# Patient Record
Sex: Male | Born: 1980 | Race: Black or African American | Hispanic: No | Marital: Single | Smoking: Current every day smoker
Health system: Southern US, Community
[De-identification: ages and names within clinical notes are randomized; demographics above are authoritative.]

## PROBLEM LIST (undated history)

## (undated) DIAGNOSIS — I10 Essential (primary) hypertension: Secondary | ICD-10-CM

---

## 2005-10-20 ENCOUNTER — Emergency Department: Payer: Self-pay | Admitting: Emergency Medicine

## 2007-04-18 ENCOUNTER — Emergency Department: Payer: Self-pay | Admitting: Internal Medicine

## 2007-10-06 ENCOUNTER — Emergency Department: Payer: Self-pay | Admitting: Emergency Medicine

## 2009-06-12 ENCOUNTER — Emergency Department: Payer: Self-pay | Admitting: Emergency Medicine

## 2009-06-13 ENCOUNTER — Emergency Department: Payer: Self-pay | Admitting: Emergency Medicine

## 2012-12-29 ENCOUNTER — Emergency Department: Payer: Self-pay | Admitting: Emergency Medicine

## 2013-01-01 ENCOUNTER — Emergency Department: Payer: Self-pay | Admitting: Internal Medicine

## 2013-06-24 ENCOUNTER — Emergency Department: Payer: Self-pay | Admitting: Emergency Medicine

## 2013-08-10 ENCOUNTER — Emergency Department: Payer: Self-pay | Admitting: Emergency Medicine

## 2013-11-09 ENCOUNTER — Emergency Department: Payer: Self-pay | Admitting: Emergency Medicine

## 2013-12-16 ENCOUNTER — Emergency Department: Payer: Self-pay | Admitting: Emergency Medicine

## 2014-01-01 ENCOUNTER — Emergency Department: Payer: Self-pay | Admitting: Emergency Medicine

## 2014-07-19 ENCOUNTER — Emergency Department: Payer: Self-pay | Admitting: Student

## 2014-10-01 ENCOUNTER — Emergency Department: Payer: Self-pay | Admitting: Emergency Medicine

## 2015-04-08 ENCOUNTER — Encounter: Payer: Self-pay | Admitting: *Deleted

## 2015-04-08 DIAGNOSIS — Z720 Tobacco use: Secondary | ICD-10-CM | POA: Diagnosis not present

## 2015-04-08 DIAGNOSIS — L02416 Cutaneous abscess of left lower limb: Secondary | ICD-10-CM | POA: Insufficient documentation

## 2015-04-08 NOTE — ED Notes (Signed)
Pt c/o boil on L medial upper thigh starting on Tues. Pt states he has been manipulating it. Pt states pain w/ ambulation and wound has been draining a little.

## 2015-04-09 ENCOUNTER — Emergency Department
Admission: EM | Admit: 2015-04-09 | Discharge: 2015-04-09 | Disposition: A | Discharge status: Home / Self Care | Payer: 59 | Attending: Emergency Medicine | Admitting: Emergency Medicine

## 2015-04-09 ENCOUNTER — Encounter: Payer: Self-pay | Admitting: General Practice

## 2015-04-09 DIAGNOSIS — L02416 Cutaneous abscess of left lower limb: Secondary | ICD-10-CM

## 2015-04-09 MED ORDER — SULFAMETHOXAZOLE-TRIMETHOPRIM 800-160 MG PO TABS: 1.0000 | ORAL_TABLET | Freq: Two times a day (BID) | ORAL | Status: DC

## 2015-04-09 MED ORDER — LIDOCAINE-EPINEPHRINE (PF) 1 %-1:200000 IJ SOLN
30.0000 mL | Freq: Once | INTRAMUSCULAR | Status: AC
  Administered 2015-04-09: 30 mL via INTRADERMAL
  Filled 2015-04-09: qty 30

## 2015-04-09 NOTE — Discharge Instructions (Signed)
Abscess  An abscess is an infected area that contains a collection of pus and debris.It can occur in almost any part of the body. An abscess is also known as a furuncle or boil.  CAUSES   An abscess occurs when tissue gets infected. This can occur from blockage of oil or sweat glands, infection of hair follicles, or a minor injury to the skin. As the body tries to fight the infection, pus collects in the area and creates pressure under the skin. This pressure causes pain. People with weakened immune systems have difficulty fighting infections and get certain abscesses more often.   SYMPTOMS  Usually an abscess develops on the skin and becomes a painful mass that is red, warm, and tender. If the abscess forms under the skin, you may feel a moveable soft area under the skin. Some abscesses break open (rupture) on their own, but most will continue to get worse without care. The infection can spread deeper into the body and eventually into the bloodstream, causing you to feel ill.   DIAGNOSIS   Your caregiver will take your medical history and perform a physical exam. A sample of fluid may also be taken from the abscess to determine what is causing your infection.  TREATMENT   Your caregiver may prescribe antibiotic medicines to fight the infection. However, taking antibiotics alone usually does not cure an abscess. Your caregiver may need to make a small cut (incision) in the abscess to drain the pus. In some cases, gauze is packed into the abscess to reduce pain and to continue draining the area.  HOME CARE INSTRUCTIONS    Only take over-the-counter or prescription medicines for pain, discomfort, or fever as directed by your caregiver.   If you were prescribed antibiotics, take them as directed. Finish them even if you start to feel better.   If gauze is used, follow your caregiver's directions for changing the gauze.   To avoid spreading the infection:   Keep your draining abscess covered with a  bandage.   Wash your hands well.   Do not share personal care items, towels, or whirlpools with others.   Avoid skin contact with others.   Keep your skin and clothes clean around the abscess.   Keep all follow-up appointments as directed by your caregiver.  SEEK MEDICAL CARE IF:    You have increased pain, swelling, redness, fluid drainage, or bleeding.   You have muscle aches, chills, or a general ill feeling.   You have a fever.  MAKE SURE YOU:    Understand these instructions.   Will watch your condition.   Will get help right away if you are not doing well or get worse.  Document Released: 04/18/2005 Document Revised: 01/08/2012 Document Reviewed: 09/21/2011  [employer] Patient Information 2015 [employer]. This information is not intended to replace advice given to you by your health care provider. Make sure you discuss any questions you have with your health care provider.  Incision and Drainage  Incision and drainage is a procedure in which a sac-like structure (cystic structure) is opened and drained. The area to be drained usually contains material such as pus, fluid, or blood.   LET YOUR CAREGIVER KNOW ABOUT:    Allergies to medicine.   Medicines taken, including vitamins, herbs, eyedrops, over-the-counter medicines, and creams.   Use of steroids (by mouth or creams).   Previous problems with anesthetics or numbing medicines.   History of bleeding problems or blood clots.     Previous surgery.   Other health problems, including diabetes and kidney problems.   Possibility of pregnancy, if this applies.  RISKS AND COMPLICATIONS   Pain.   Bleeding.   Scarring.   Infection.  BEFORE THE PROCEDURE   You may need to have an ultrasound or other imaging tests to see how large or deep your cystic structure is. Blood tests may also be used to determine if you have an infection or how severe the infection is. You may need to have a tetanus shot.  PROCEDURE   The affected area is cleaned with a  cleaning fluid. The cyst area will then be numbed with a medicine (local anesthetic). A small incision will be made in the cystic structure. A syringe or catheter may be used to drain the contents of the cystic structure, or the contents may be squeezed out. The area will then be flushed with a cleansing solution. After cleansing the area, it is often gently packed with a gauze or another wound dressing. Once it is packed, it will be covered with gauze and tape or some other type of wound dressing.  AFTER THE PROCEDURE    Often, you will be allowed to go home right after the procedure.   You may be given antibiotic medicine to prevent or heal an infection.   If the area was packed with gauze or some other wound dressing, you will likely need to come back in 1 to 2 days to get it removed.   The area should heal in about 14 days.  Document Released: 01/02/2001 Document Revised: 01/08/2012 Document Reviewed: 09/03/2011  [employer] Patient Information 2015 [employer]. This information is not intended to replace advice given to you by your health care provider. Make sure you discuss any questions you have with your health care provider.

## 2015-04-09 NOTE — ED Provider Notes (Signed)
[facility] Emergency Department Provider Note  ____________________________________________  Time seen: 5:00 AM  I have reviewed the triage vital signs and the nursing notes.   HISTORY  Chief Complaint Abscess    HPI Alexander Hebert is a 34 y.o. male who complains of an abscess to the left thigh over the past 3 days. Gradually gotten worse where he is pain and swelling in that area. Yesterday it was draining some purulent fluid although it is stopped now. He reports he's had multiple abscesses in the past. Fever chills abdominal pain genital lesions or dysuria or hematuria.     History reviewed. No pertinent past medical history.   There are no active problems to display for this patient.    History reviewed. No pertinent past surgical history.   Current Outpatient Rx  Name  Route  Sig  Dispense  Refill  . sulfamethoxazole-trimethoprim (BACTRIM DS) 800-160 MG per tablet   Oral   Take 1 tablet by mouth 2 (two) times daily.   14 tablet   0      Allergies Review of patient's allergies indicates no known allergies.   History reviewed. No pertinent family history.  Social History Social History  Substance Use Topics  . Smoking status: Current Every Day Smoker -- 1.50 packs/day    Types: Cigarettes  . Smokeless tobacco: Never Used  . Alcohol Use: Yes     Comment: occasionally    Review of Systems  Constitutional:   No fever or chills. No weight changes Eyes:   No blurry vision or double vision.  ENT:   No sore throat. Cardiovascular:   No chest pain. Respiratory:   No dyspnea or cough. Gastrointestinal:   Negative for abdominal pain, vomiting and diarrhea.  No BRBPR or melena. Genitourinary:   Negative for dysuria, urinary retention, bloody urine, or difficulty urinating. Musculoskeletal:   Negative for back pain. No joint swelling or pain. Skin:   Negative for rash. Abscess as above Neurological:   Negative for headaches, focal  weakness or numbness. Psychiatric:  No anxiety or depression.   Endocrine:  No hot/cold intolerance, changes in energy, or sleep difficulty.  10-point ROS otherwise negative.  ____________________________________________   PHYSICAL EXAM:  VITAL SIGNS: ED Triage Vitals  Enc Vitals Group     BP 04/08/15 2341 155/91 mmHg     Pulse Rate 04/08/15 2341 70     Resp 04/08/15 2341 20     Temp 04/08/15 2341 98.2 F (36.8 C)     Temp Source 04/08/15 2341 Oral     SpO2 04/08/15 2341 97 %     Weight 04/08/15 2341 274 lb (124.286 kg)     Height 04/08/15 2341 6\' 1"  (1.854 m)     Head Cir --      Peak Flow --      Pain Score 04/08/15 2345 7     Pain Loc --      Pain Edu? --      Excl. in GC? --      Constitutional:   Alert and oriented. Well appearing and in no distress. Eyes:   No scleral icterus. No conjunctival pallor. PERRL. EOMI ENT   Head:   Normocephalic and atraumatic.   Nose:   No congestion/rhinnorhea. No septal hematoma   Mouth/Throat:   MMM, no pharyngeal erythema. No peritonsillar mass. No uvula shift.   Neck:   No stridor. No SubQ emphysema. No meningismus. Hematological/Lymphatic/Immunilogical:   No cervical lymphadenopathy. Gastrointestinal:  Soft and nontender. No distention. There is no CVA tenderness.  No rebound, rigidity, or guarding. Genitourinary:   Normal genitalia Musculoskeletal:   Nontender with normal range of motion in all extremities. No joint effusions.  No lower extremity tenderness.  No edema. Neurologic:   Normal speech and language.  CN 2-10 normal. Motor grossly intact. No pronator drift.  Normal gait. No gross focal neurologic deficits are appreciated.  Skin:    There is a 3 cm round fluctuant abscess on the left medial thigh. No significant surrounding cellulitis. The area is tender to touch..  No petechiae, purpura, or bullae. Psychiatric:   Mood and affect are normal. Speech and behavior are normal. Patient exhibits appropriate  insight and judgment.  ____________________________________________    LABS (pertinent positives/negatives) (all labs ordered are listed, but only abnormal results are displayed) Labs Reviewed - No data to display ____________________________________________   EKG    ____________________________________________    RADIOLOGY    ____________________________________________   PROCEDURES INCISION AND DRAINAGE Performed by: Sharman Cheek Consent: Verbal consent obtained. Risks and benefits: risks, benefits and alternatives were discussed Type: abscess  Body area: Left thigh  Anesthesia: local infiltration  Incision was made with a scalpel.  Local anesthetic: lidocaine 1% with epinephrine  Anesthetic total: 1 ml  Complexity: complex Blunt dissection to break up loculations  Drainage: purulent  Drainage amount: 1ml  Packing material: 1/4 in iodoform gauze  Patient tolerance: Patient tolerated the procedure well with no immediate complications.     ____________________________________________   INITIAL IMPRESSION / ASSESSMENT AND PLAN / ED COURSE  Pertinent labs & imaging results that were available during my care of the patient were reviewed by me and considered in my medical decision making (see chart for details).   Patient presents with superficial abscess of the left thigh. This was incised and drained in the emergency Department with packing placed. Due to the patient's recurrent abscesses in the past, we'll place him on Bactrim. He is overall well-appearing no evidence of sepsis or any other spreading infection.     ____________________________________________   FINAL CLINICAL IMPRESSION(S) / ED DIAGNOSES  Final diagnoses:  Abscess of left thigh      Sharman Cheek, MD 04/09/15 [phone]

## 2015-04-09 NOTE — ED Notes (Signed)
Provided EDP with lidocaine.

## 2015-04-19 ENCOUNTER — Emergency Department
Admission: EM | Admit: 2015-04-19 | Discharge: 2015-04-19 | Discharge status: Against Medical Advice | Payer: 59 | Attending: Emergency Medicine | Admitting: Emergency Medicine

## 2015-04-19 ENCOUNTER — Encounter: Payer: Self-pay | Admitting: Emergency Medicine

## 2015-04-19 ENCOUNTER — Emergency Department: Payer: 59

## 2015-04-19 DIAGNOSIS — R52 Pain, unspecified: Secondary | ICD-10-CM

## 2015-04-19 DIAGNOSIS — Y998 Other external cause status: Secondary | ICD-10-CM | POA: Insufficient documentation

## 2015-04-19 DIAGNOSIS — S0591XA Unspecified injury of right eye and orbit, initial encounter: Secondary | ICD-10-CM | POA: Insufficient documentation

## 2015-04-19 DIAGNOSIS — W2209XA Striking against other stationary object, initial encounter: Secondary | ICD-10-CM | POA: Insufficient documentation

## 2015-04-19 DIAGNOSIS — Y9289 Other specified places as the place of occurrence of the external cause: Secondary | ICD-10-CM | POA: Insufficient documentation

## 2015-04-19 DIAGNOSIS — Y9302 Activity, running: Secondary | ICD-10-CM | POA: Insufficient documentation

## 2015-04-19 NOTE — ED Notes (Addendum)
Patient ambulatory to triage with steady gait, without difficulty or distress noted; pt reports right eye redness/pain after running into door this morning at 330am; right eye 20/25, left 20/20; sclera reddened with purplish discoloration noted to top left side; pt reports seeing line across his vision

## 2015-04-19 NOTE — ED Notes (Signed)
Called cell phone no answer

## 2015-04-19 NOTE — ED Notes (Signed)
Called no answer in lobby. physically looked in lobby,outside,and sub wait. No answer

## 2015-04-19 NOTE — ED Notes (Signed)
Called no answer in lobby  

## 2016-10-26 IMAGING — CT CT ORBITS W/O CM
3 series · 16 of 47 positions shown, 19 images · non-contrast
Comparison: None.

CLINICAL DATA: Ran into door this morning at [DATE] a.m., red eye,
discoloration, vision changes.

EXAM:
CT ORBITS WITHOUT CONTRAST
TECHNIQUE: Multidetector CT imaging of the orbits was performed following the
standard protocol without intravenous contrast.

[Series 3: orbits soft · axial · 0.26mm/px · z∈[-190,-92]mm · 10 of 57 slices shown, 13 images]
[im 4/57  brain]
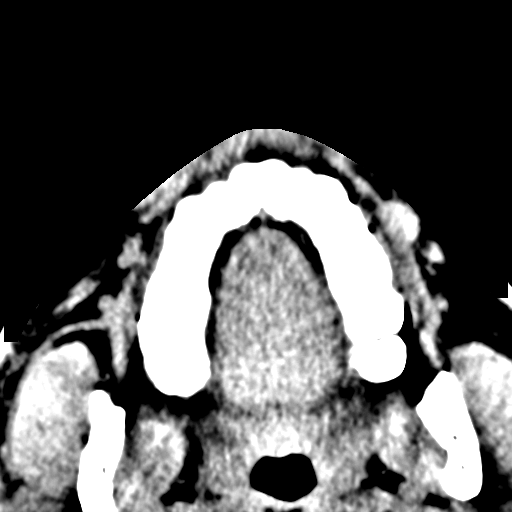
[im 4/57  bone]
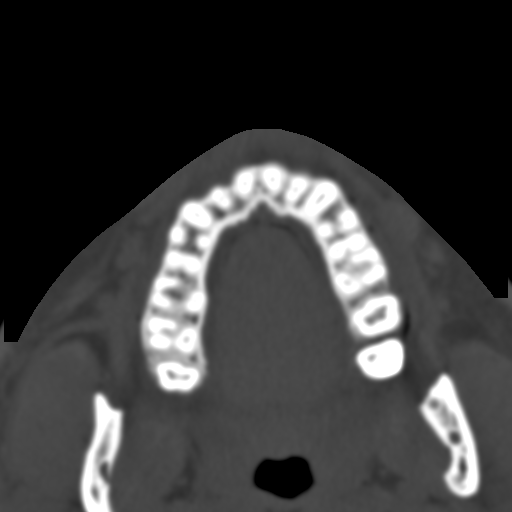
[im 10/57  bone]
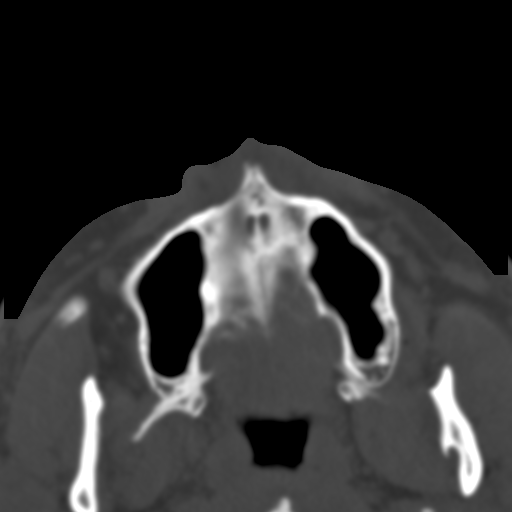
[im 16/57  bone]
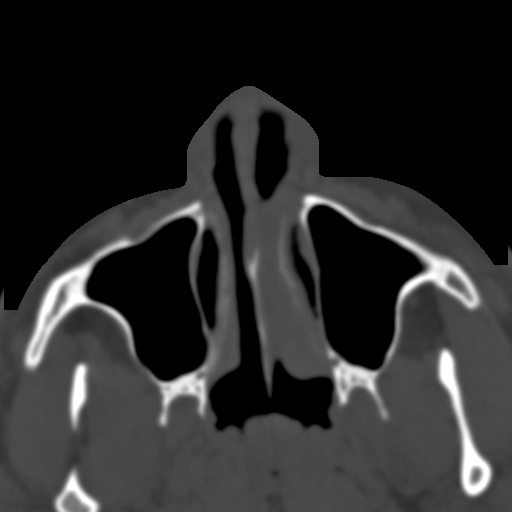
[im 20/57  bone]
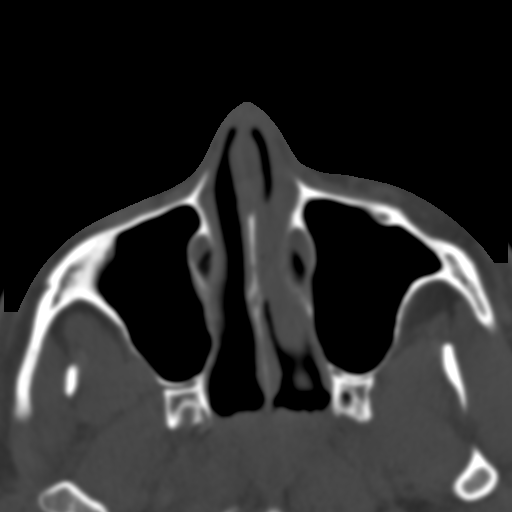
[im 26/57  brain]
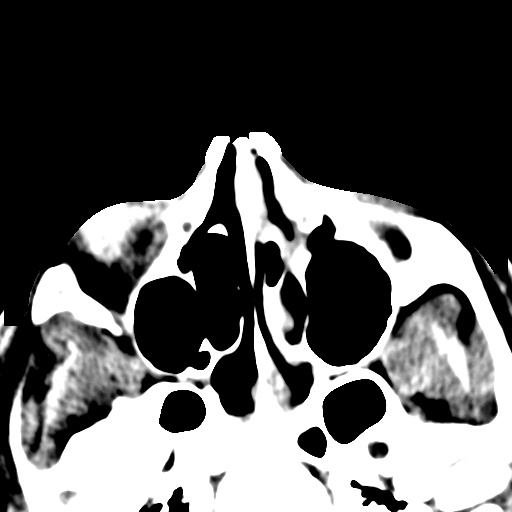
[im 26/57  bone]
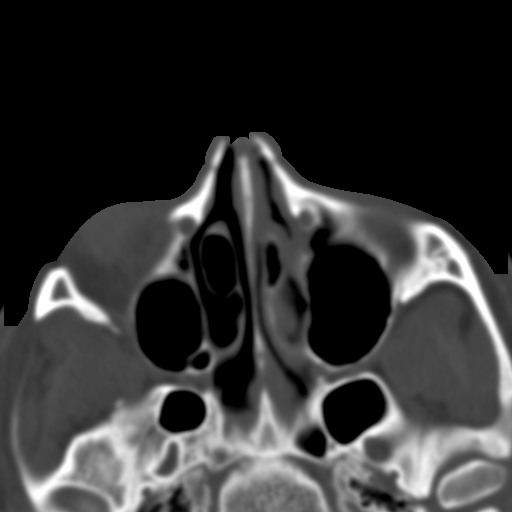
[im 31/57  bone]
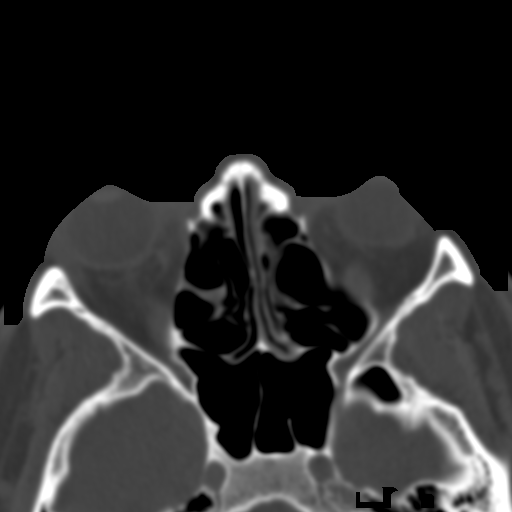
[im 37/57  bone]
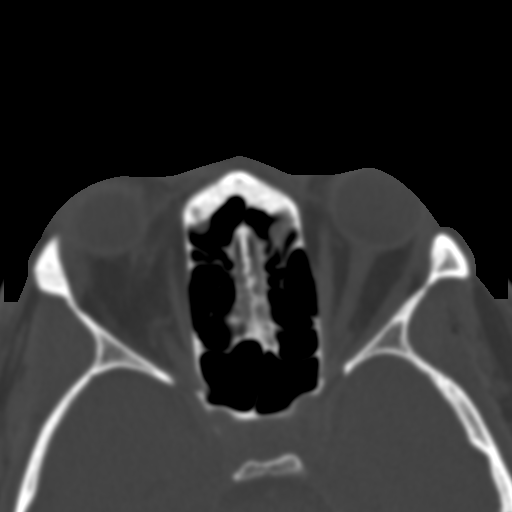
[im 43/57  bone]
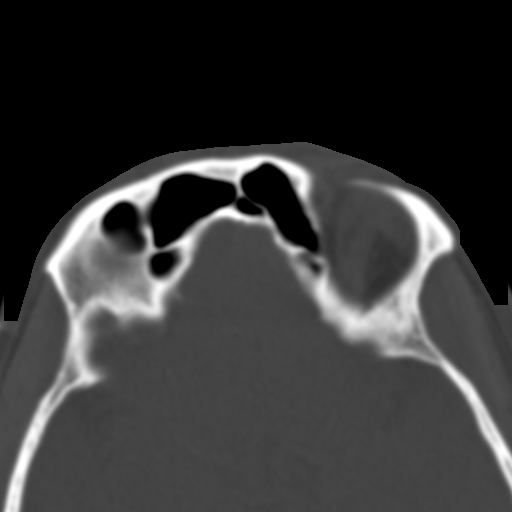
[im 47/57  brain]
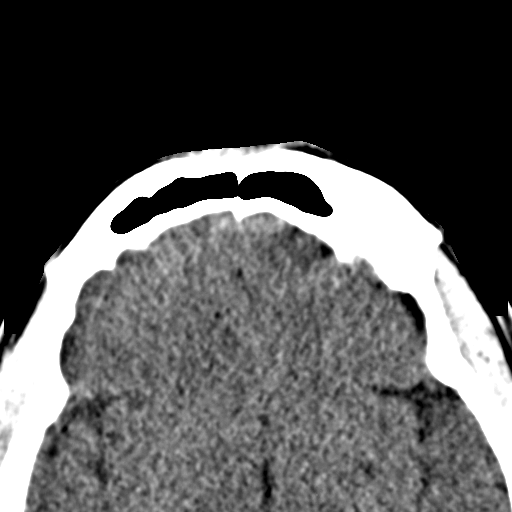
[im 47/57  bone]
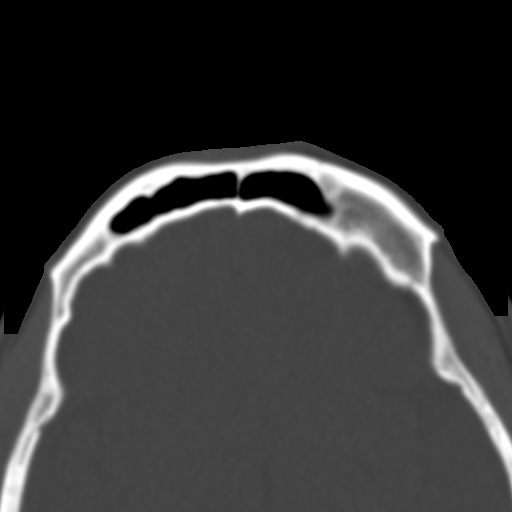
[im 53/57  bone]
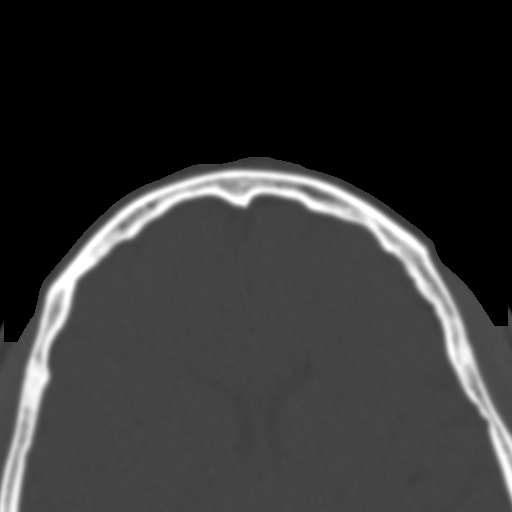

[Series 5: coronal soft · coronal · 0.24mm/px · 3 of 53 slices shown]
[im 18/53  bone]
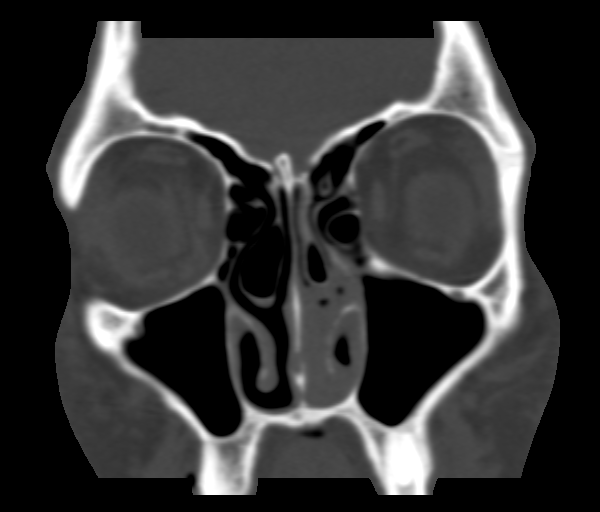
[im 24/53  bone]
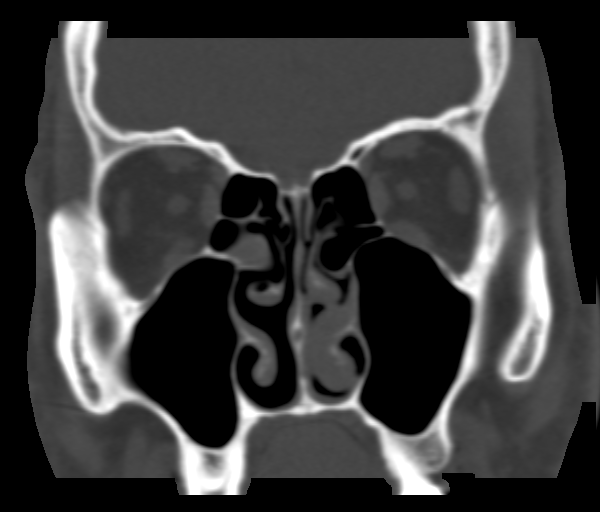
[im 29/53  bone]
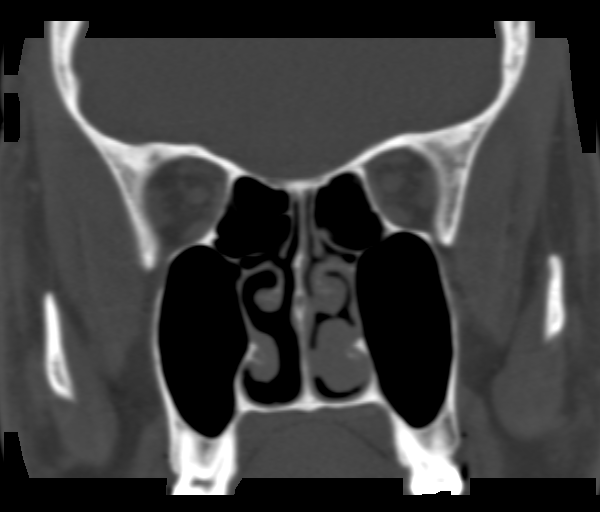

[Series 6: sagittal soft · sagittal · 0.21mm/px · 3 of 68 slices shown]
[im 23/68  bone]
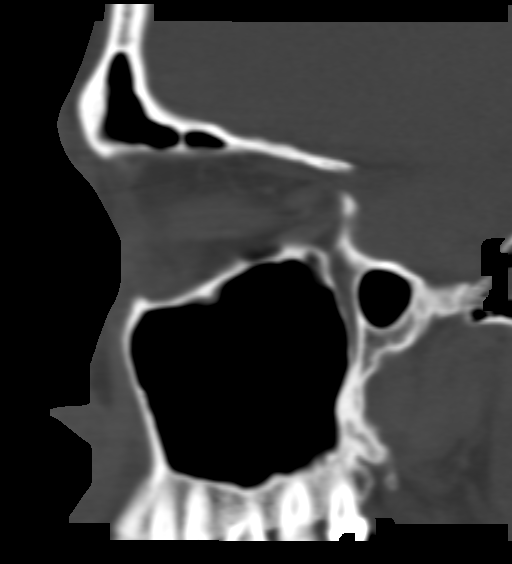
[im 34/68  bone]
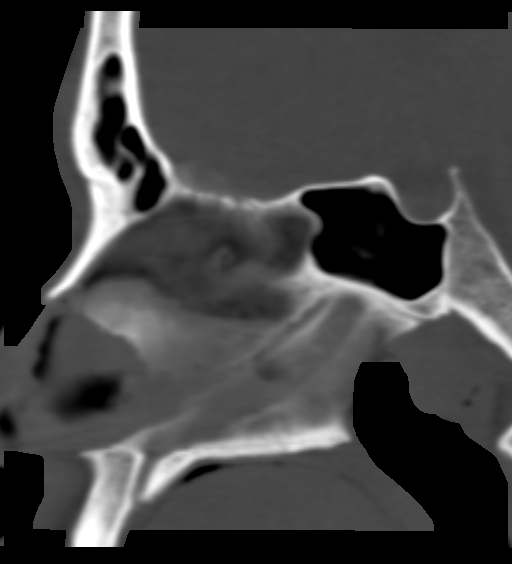
[im 45/68  bone]
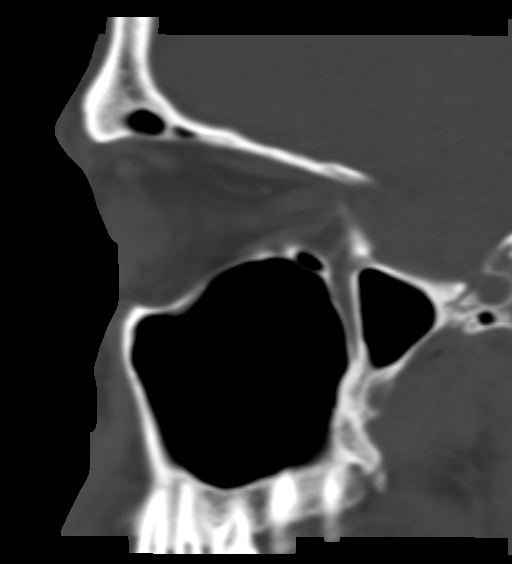

[16 of 47 positions shown; findings below may reference images not displayed]

FINDINGS: Orbits: Orbital walls and rims intact. No fracture. No destructive
bony lesions. Ocular globes intact, lenses are located. Small amount
of RIGHT fat stranding compatible with blood products. Normal
appearance optic nerve sheath complexes. Normal appearance of the
extraocular muscles.

Sinuses: Tiny LEFT maxillary mucosal retention cyst and mild
paranasal sinus mucosal thickening. Bilateral concha bullosa. Nasal
septum is midline.

Soft tissues: Mild RIGHT periorbital soft tissue swelling without
subcutaneous gas or radiopaque foreign bodies.
IMPRESSION: Small amount of RIGHT retrobulbar blood products. Mild RIGHT
periorbital soft tissue swelling.

No orbital fracture.

## 2017-01-21 ENCOUNTER — Encounter: Payer: Self-pay | Admitting: Emergency Medicine

## 2017-01-21 ENCOUNTER — Emergency Department
Admission: EM | Admit: 2017-01-21 | Discharge: 2017-01-21 | Disposition: A | Discharge status: Home / Self Care | Payer: 59 | Attending: Emergency Medicine | Admitting: Emergency Medicine

## 2017-01-21 DIAGNOSIS — Z5321 Procedure and treatment not carried out due to patient leaving prior to being seen by health care provider: Secondary | ICD-10-CM | POA: Insufficient documentation

## 2017-01-21 DIAGNOSIS — R0981 Nasal congestion: Secondary | ICD-10-CM | POA: Insufficient documentation

## 2017-01-21 HISTORY — DX: Essential (primary) hypertension: I10

## 2017-01-21 NOTE — ED Triage Notes (Signed)
C/O sinus congestion x 1 day.  [place].  Patient has history of HTN, but is not currently on any antihypertensives.  States something was given to him while he was in jail, but he cannot remember the name of the medication.

## 2017-01-21 NOTE — ED Notes (Signed)
Pt to front desk, informs this RN that [facility] is [facility] to wait [facility]. Pt informed that he will be going back to room shortly, CPOD called for room. Pt refusing to stay.

## 2017-05-07 ENCOUNTER — Emergency Department
Admission: EM | Admit: 2017-05-07 | Discharge: 2017-05-07 | Disposition: A | Discharge status: Home / Self Care | Payer: 59 | Attending: Emergency Medicine | Admitting: Emergency Medicine

## 2017-05-07 DIAGNOSIS — I10 Essential (primary) hypertension: Secondary | ICD-10-CM | POA: Insufficient documentation

## 2017-05-07 DIAGNOSIS — F17210 Nicotine dependence, cigarettes, uncomplicated: Secondary | ICD-10-CM | POA: Insufficient documentation

## 2017-05-07 DIAGNOSIS — Z79899 Other long term (current) drug therapy: Secondary | ICD-10-CM | POA: Insufficient documentation

## 2017-05-07 DIAGNOSIS — L0291 Cutaneous abscess, unspecified: Secondary | ICD-10-CM

## 2017-05-07 DIAGNOSIS — L02211 Cutaneous abscess of abdominal wall: Secondary | ICD-10-CM | POA: Insufficient documentation

## 2017-05-07 MED ORDER — NAPROXEN 500 MG PO TABS: 500.0000 mg | ORAL_TABLET | Freq: Two times a day (BID) | ORAL | Status: DC

## 2017-05-07 MED ORDER — TRAMADOL HCL 50 MG PO TABS: 50.0000 mg | ORAL_TABLET | Freq: Four times a day (QID) | ORAL | 0 refills | Status: DC | PRN

## 2017-05-07 MED ORDER — NAPROXEN 500 MG PO TABS
500.0000 mg | ORAL_TABLET | Freq: Once | ORAL | Status: AC
  Administered 2017-05-07: 500 mg via ORAL
  Filled 2017-05-07: qty 1

## 2017-05-07 MED ORDER — SULFAMETHOXAZOLE-TRIMETHOPRIM 800-160 MG PO TABS
1.0000 | ORAL_TABLET | Freq: Once | ORAL | Status: AC
  Administered 2017-05-07: 1 via ORAL
  Filled 2017-05-07: qty 1

## 2017-05-07 MED ORDER — SULFAMETHOXAZOLE-TRIMETHOPRIM 800-160 MG PO TABS: 1.0000 | ORAL_TABLET | Freq: Two times a day (BID) | ORAL | 0 refills | Status: DC

## 2017-05-07 NOTE — Discharge Instructions (Signed)
Wash the area twice a day with antibacterial soap.

## 2017-05-07 NOTE — ED Provider Notes (Signed)
[facility] Emergency Department Provider Note   ____________________________________________   First MD Initiated Contact with Patient 05/07/17 1311     (approximate)  I have reviewed the triage vital signs and the nursing notes.   HISTORY  Chief Complaint Abscess    HPI Alexander Hebert is a 36 y.o. male complaining of an abscess anterior lower abdomen for 2-3 days. Patient state last night he tried to expressmaterial from the lesion and awakened this morning for increased pain and edema. Pain to 7/10. Patient described a pain as "achy". No other palliative measures for complaint.   Past Medical History:  Diagnosis Date  . Hypertension     There are no active problems to display for this patient.   History reviewed. No pertinent surgical history.  Prior to Admission medications   Medication Sig Start Date End Date Taking? Authorizing Provider  naproxen (NAPROSYN) 500 MG tablet Take 1 tablet (500 mg total) by mouth 2 (two) times daily with a meal. 05/07/17   Joni Reining, PA-C  sulfamethoxazole-trimethoprim (BACTRIM DS) 800-160 MG per tablet Take 1 tablet by mouth 2 (two) times daily. 04/09/15   Sharman Cheek, MD  sulfamethoxazole-trimethoprim (BACTRIM DS,SEPTRA DS) 800-160 MG tablet Take 1 tablet by mouth 2 (two) times daily. 05/07/17   Joni Reining, PA-C  traMADol (ULTRAM) 50 MG tablet Take 1 tablet (50 mg total) by mouth every 6 (six) hours as needed for moderate pain. 05/07/17   Joni Reining, PA-C    Allergies Patient has no known allergies.  No family history on file.  Social History Social History  Substance Use Topics  . Smoking status: Current Every Day Smoker    Packs/day: 1.50    Types: Cigarettes  . Smokeless tobacco: Never Used  . Alcohol use Yes     Comment: occasionally    Review of Systems Constitutional: No fever/chills Eyes: No visual changes. ENT: No sore throat. Cardiovascular: Denies chest  pain. Respiratory: Denies shortness of breath. Gastrointestinal: No abdominal pain.  No nausea, no vomiting.  No diarrhea.  No constipation. Genitourinary: Negative for dysuria. Musculoskeletal: Negative for back pain. Skin:abdominal lesion. Neurological: Negative for headaches, focal weakness or numbness.   ____________________________________________   PHYSICAL EXAM:  VITAL SIGNS: ED Triage Vitals  Enc Vitals Group     BP 05/07/17 1247 (!) 155/106     Pulse Rate 05/07/17 1247 80     Resp 05/07/17 1247 18     Temp 05/07/17 1247 98.1 F (36.7 C)     Temp Source 05/07/17 1247 Oral     SpO2 05/07/17 1247 96 %     Weight 05/07/17 1248 270 lb (122.5 kg)     Height 05/07/17 1248  (1.854 m)     Head Circumference --      Peak Flow --      Pain Score 05/07/17 1247 7     Pain Loc --      Pain Edu? --      Excl. in GC? --     Constitutional: Alert and oriented. Well appearing and in no acute distress. Cardiovascular: Normal rate, regular rhythm. Grossly normal heart sounds.  Good peripheral circulation. Respiratory: Normal respiratory effort.  No retractions. Lungs CTAB. Gastrointestinal: Soft and nontender. No distention. No abdominal bruits. No CVA tenderness. Musculoskeletal: No lower extremity tenderness nor edema.  No joint effusions. Neurologic:  Normal speech and language. No gross focal neurologic deficits are appreciated. No gait instability. Skin:  nonfluctuant nodule  lesion anterior lower abdomen Psychiatric: Mood and affect are normal. Speech and behavior are normal.  ____________________________________________   LABS (all labs ordered are listed, but only abnormal results are displayed)  Labs Reviewed - No data to display ____________________________________________  EKG   ____________________________________________  RADIOLOGY  No results found.  ____________________________________________   PROCEDURES  Procedure(s) performed:  None  Procedures  Critical Care performed: No  ____________________________________________   INITIAL IMPRESSION / ASSESSMENT AND PLAN / ED COURSE  As part of my medical decision making, I reviewed the following data within the electronic MEDICAL RECORD NUMBER    Abscess anterior lower abdomen. Discuss rationale for not I&D  at this time. Patient given discharge Instructions. Patient given prescription for Bactrim DS, Tramadol, and naproxen.patient advised to follow-up condition worsens.      ____________________________________________   FINAL CLINICAL IMPRESSION(S) / ED DIAGNOSES  Final diagnoses:  Abscess      NEW MEDICATIONS STARTED DURING THIS VISIT:  New Prescriptions   NAPROXEN (NAPROSYN) 500 MG TABLET    Take 1 tablet (500 mg total) by mouth 2 (two) times daily with a meal.   SULFAMETHOXAZOLE-TRIMETHOPRIM (BACTRIM DS,SEPTRA DS) 800-160 MG TABLET    Take 1 tablet by mouth 2 (two) times daily.   TRAMADOL (ULTRAM) 50 MG TABLET    Take 1 tablet (50 mg total) by mouth every 6 (six) hours as needed for moderate pain.     Note:  This document was prepared using Dragon voice recognition software and may include unintentional dictation errors.    Joni Reining, PA-C 05/07/17 1328    Sharman Cheek, MD 05/07/17 [id number]

## 2017-05-07 NOTE — ED Triage Notes (Signed)
Pt c/o red raised area to the lower abdomin for the past week. Denies any drainage.

## 2017-08-23 ENCOUNTER — Encounter: Payer: Self-pay | Admitting: Emergency Medicine

## 2017-08-23 ENCOUNTER — Other Ambulatory Visit: Payer: Self-pay

## 2017-08-23 ENCOUNTER — Emergency Department
Admission: EM | Admit: 2017-08-23 | Discharge: 2017-08-23 | Disposition: A | Discharge status: Home / Self Care | Payer: 59 | Attending: Emergency Medicine | Admitting: Emergency Medicine

## 2017-08-23 DIAGNOSIS — J370 Chronic laryngitis: Secondary | ICD-10-CM | POA: Diagnosis not present

## 2017-08-23 DIAGNOSIS — I10 Essential (primary) hypertension: Secondary | ICD-10-CM | POA: Insufficient documentation

## 2017-08-23 DIAGNOSIS — F17210 Nicotine dependence, cigarettes, uncomplicated: Secondary | ICD-10-CM | POA: Diagnosis not present

## 2017-08-23 DIAGNOSIS — J029 Acute pharyngitis, unspecified: Secondary | ICD-10-CM | POA: Diagnosis present

## 2017-08-23 MED ORDER — METHYLPREDNISOLONE 4 MG PO TBPK: ORAL_TABLET | ORAL | 0 refills | Status: DC

## 2017-08-23 NOTE — Discharge Instructions (Signed)
Follow-up with your regular doctor or the ear nose and throat doctor.  He will need to call and make an appointment with Dr. Jenne CampusMcQueen.  Take medication as prescribed

## 2017-08-23 NOTE — ED Notes (Signed)
Pt ambulatory at discharge. Verbalized understanding of discharge instructions, follow-up care and prescription. A&O x4. Skin warm and dry.

## 2017-08-23 NOTE — ED Provider Notes (Signed)
[facility] Emergency Department Provider Note  ____________________________________________   First MD Initiated Contact with Patient 08/23/17 1344     (approximate)  I have reviewed the triage vital signs and the nursing notes.   HISTORY  Chief Complaint Sore Throat    HPI Alexander Hebert is a 37 y.o. male planes of hoarseness since Christmas day.  His artery was somewhat phone and then all of a sudden his voice went.  He is not been sick.  He has some drainage but he attributes that to his cocaine use.  He denies fever chills.  Denies chest pain or shortness of breath  Past Medical History:  Diagnosis Date  . Hypertension     There are no active problems to display for this patient.   History reviewed. No pertinent surgical history.  Prior to Admission medications   Medication Sig Start Date End Date Taking? Authorizing Provider  methylPREDNISolone (MEDROL DOSEPAK) 4 MG TBPK tablet Take 6 pills on day one then decrease by 1 pill each day 08/23/17   Faythe Ghee, PA-C    Allergies Patient has no known allergies.  No family history on file.  Social History Social History   Tobacco Use  . Smoking status: Current Every Day Smoker    Packs/day: 1.50    Types: Cigarettes  . Smokeless tobacco: Never Used  Substance Use Topics  . Alcohol use: Yes    Comment: occasionally  . Drug use: Yes    Frequency: 7.0 times per week    Types: Marijuana    Review of Systems  Constitutional: No fever/chills Eyes: No visual changes. ENT: No sore throat.  Positive for laryngitis Respiratory: Denies cough Genitourinary: Negative for dysuria. Musculoskeletal: Negative for back pain. Skin: Negative for rash.    ____________________________________________   PHYSICAL EXAM:  VITAL SIGNS: ED Triage Vitals  Enc Vitals Group     BP 08/23/17 1335 (!) 143/91     Pulse Rate 08/23/17 1335 88     Resp 08/23/17 1335 18     Temp 08/23/17 1335 98.1  F (36.7 C)     Temp Source 08/23/17 1335 Oral     SpO2 08/23/17 1335 96 %     Weight 08/23/17 1325 275 lb (124.7 kg)     Height 08/23/17 1325 6\' 1"  (1.854 m)     Head Circumference --      Peak Flow --      Pain Score --      Pain Loc --      Pain Edu? --      Excl. in GC? --     Constitutional: Alert and oriented. Well appearing and in no acute distress. Eyes: Conjunctivae are normal.  Head: Atraumatic. Nose: No congestion/rhinnorhea. Mouth/Throat: Mucous membranes are moist.  It is irritated.  Voice is worse Neck: No lymphadenopathy noted Cardiovascular: Normal rate, regular rhythm.  Sounds are normal Respiratory: Normal respiratory effort.  No retractions, lungs clear to auscultation GU: deferred Musculoskeletal: FROM all extremities, warm and well perfused Neurologic:  Normal speech and language.  Skin:  Skin is warm, dry and intact. No rash noted. Psychiatric: Mood and affect are normal. Speech and behavior are normal.  ____________________________________________   LABS (all labs ordered are listed, but only abnormal results are displayed)  Labs Reviewed - No data to display ____________________________________________   ____________________________________________  RADIOLOGY    ____________________________________________   PROCEDURES  Procedure(s) performed: No  Procedures    ____________________________________________   INITIAL  IMPRESSION / ASSESSMENT AND PLAN / ED COURSE  Pertinent labs & imaging results that were available during my care of the patient were reviewed by me and considered in my medical decision making (see chart for details).  Patient is 37 year old male complaining of laryngitis since December 25.  He states he does have some drainage but he attributes this to his cocaine use.  He denies any fever chills.  Denies any cough or congestion.  On physical exam the patient appears well.  His voice is hoarse.  There is no  lymphadenopathy noted   Diagnosis is chronic laryngitis.  Trial of Medrol Dosepak.  Patient is follow-up with ear nose and throat.  A number was provided.  He was instructed to call make an appointment.  Patient states he understands will comply with the recommendations.  He was also instructed to quit using cocaine.  Patient states he does not think he is going to do that.  Patient was discharged in stable condition  As part of my medical decision making, I reviewed the following data within the electronic MEDICAL RECORD NUMBER Nursing notes reviewed and incorporated, Old chart reviewed, Notes from prior ED visits and [place] Controlled Substance Database  ____________________________________________   FINAL CLINICAL IMPRESSION(S) / ED DIAGNOSES  Final diagnoses:  Laryngitis, chronic      NEW MEDICATIONS STARTED DURING THIS VISIT:  New Prescriptions   METHYLPREDNISOLONE (MEDROL DOSEPAK) 4 MG TBPK TABLET    Take 6 pills on day one then decrease by 1 pill each day     Note:  This document was prepared using Dragon voice recognition software and may include unintentional dictation errors.    Faythe GheeFisher, Christina Waldrop W, PA-C 08/23/17 1354    Jeanmarie PlantMcShane, James A, MD 08/23/17 [phone]

## 2017-08-23 NOTE — ED Triage Notes (Signed)
Presents with "dry throat" since around christmas  No fever  States it is hard to swallow at times  No fever

## 2017-08-23 NOTE — ED Notes (Signed)
Pt presents to registration with "raspy" voice since December. No shob noted.

## 2019-04-17 ENCOUNTER — Emergency Department: Payer: Self-pay

## 2019-04-17 ENCOUNTER — Other Ambulatory Visit: Payer: Self-pay

## 2019-04-17 ENCOUNTER — Emergency Department
Admission: EM | Admit: 2019-04-17 | Discharge: 2019-04-17 | Disposition: A | Discharge status: Home / Self Care | Payer: Self-pay | Attending: Emergency Medicine | Admitting: Emergency Medicine

## 2019-04-17 DIAGNOSIS — Y999 Unspecified external cause status: Secondary | ICD-10-CM | POA: Diagnosis not present

## 2019-04-17 DIAGNOSIS — I10 Essential (primary) hypertension: Secondary | ICD-10-CM | POA: Diagnosis not present

## 2019-04-17 DIAGNOSIS — Y9389 Activity, other specified: Secondary | ICD-10-CM | POA: Insufficient documentation

## 2019-04-17 DIAGNOSIS — F17210 Nicotine dependence, cigarettes, uncomplicated: Secondary | ICD-10-CM | POA: Diagnosis not present

## 2019-04-17 DIAGNOSIS — M79661 Pain in right lower leg: Secondary | ICD-10-CM | POA: Insufficient documentation

## 2019-04-17 DIAGNOSIS — Y92410 Unspecified street and highway as the place of occurrence of the external cause: Secondary | ICD-10-CM | POA: Diagnosis not present

## 2019-04-17 MED ORDER — IBUPROFEN 600 MG PO TABS: 600.0000 mg | ORAL_TABLET | Freq: Four times a day (QID) | ORAL | 0 refills | Status: AC | PRN

## 2019-04-17 MED ORDER — CYCLOBENZAPRINE HCL 5 MG PO TABS: ORAL_TABLET | ORAL | 0 refills | Status: AC
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
## 2019-04-17 NOTE — Discharge Instructions (Signed)
You can take Flexeril to help relax muscles.  Do not drive or work while taking Flexeril.  Take ibuprofen for pain inflammation.
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
## 2019-10-12 ENCOUNTER — Encounter: Payer: Self-pay | Admitting: Emergency Medicine

## 2019-10-12 ENCOUNTER — Other Ambulatory Visit: Payer: Self-pay

## 2019-10-12 ENCOUNTER — Emergency Department
Admission: EM | Admit: 2019-10-12 | Discharge: 2019-10-12 | Disposition: A | Discharge status: Home / Self Care | Payer: HRSA Program | Attending: Emergency Medicine | Admitting: Emergency Medicine

## 2019-10-12 DIAGNOSIS — F17210 Nicotine dependence, cigarettes, uncomplicated: Secondary | ICD-10-CM | POA: Diagnosis not present

## 2019-10-12 DIAGNOSIS — Z20822 Contact with and (suspected) exposure to covid-19: Secondary | ICD-10-CM

## 2019-10-12 DIAGNOSIS — B349 Viral infection, unspecified: Secondary | ICD-10-CM

## 2019-10-12 DIAGNOSIS — I10 Essential (primary) hypertension: Secondary | ICD-10-CM | POA: Diagnosis not present

## 2019-10-12 DIAGNOSIS — R05 Cough: Secondary | ICD-10-CM | POA: Diagnosis present

## 2019-10-12 DIAGNOSIS — U071 COVID-19: Secondary | ICD-10-CM | POA: Insufficient documentation

## 2019-10-12 MED ORDER — ACETAMINOPHEN-CODEINE #3 300-30 MG PO TABS: 1.0000 | ORAL_TABLET | Freq: Four times a day (QID) | ORAL | 0 refills | Status: DC | PRN

## 2019-10-12 MED ORDER — NAPROXEN 500 MG PO TABS: 500.0000 mg | ORAL_TABLET | Freq: Two times a day (BID) | ORAL | 0 refills | Status: AC

## 2019-10-12 NOTE — Discharge Instructions (Signed)
Do not take additional acetaminophen (Tylenol) with the Tylenol#3.  Take your BP medication as soon as you get home.  Follow up with primary care or return to the ER for symptoms of concern.

## 2019-10-12 NOTE — ED Notes (Signed)
BP taken on right and left arms, high bilaterally, states he didn't take his BP this am. NAD .

## 2019-10-12 NOTE — ED Provider Notes (Signed)
[facility] Emergency Department Provider Note  ____________________________________________  Time seen: Approximately 4:50 PM  I have reviewed the triage vital signs and the nursing notes.   HISTORY  Chief Complaint Generalized Body Aches, Headache, and Fever   HPI Alexander Hebert is a 39 y.o. male who presents to the emergency department for treatment and evaluation of cough, fever, body aches that started last night. Girlfriend tested positive for COVID-19. No alleviating measures prior to arrival.   Past Medical History:  Diagnosis Date  . Hypertension     There are no problems to display for this patient.   History reviewed. No pertinent surgical history.  Prior to Admission medications   Medication Sig Start Date End Date Taking? Authorizing Provider  acetaminophen-codeine (TYLENOL #3) 300-30 MG tablet Take 1-2 tablets by mouth every 6 (six) hours as needed for moderate pain. 10/12/19   Kem Boroughs B, FNP  cyclobenzaprine (FLEXERIL) 5 MG tablet Take 1-2 tablets 3 times daily as needed 04/17/19   Enid Derry, PA-C  ibuprofen (ADVIL) 600 MG tablet Take 1 tablet (600 mg total) by mouth every 6 (six) hours as needed. 04/17/19   Enid Derry, PA-C  naproxen (NAPROSYN) 500 MG tablet Take 1 tablet (500 mg total) by mouth 2 (two) times daily with a meal. 10/12/19   Yoshie Kosel B, FNP    Allergies Patient has no known allergies.  No family history on file.  Social History Social History   Tobacco Use  . Smoking status: Current Every Day Smoker    Packs/day: 1.50    Types: Cigarettes  . Smokeless tobacco: Never Used  Substance Use Topics  . Alcohol use: Yes    Comment: occasionally  . Drug use: Yes    Frequency: 7.0 times per week    Types: Marijuana    Review of Systems Constitutional: Positive for fever/chills. Normal appetite. ENT: Negative for sore throat. Cardiovascular: Denies chest pain. Respiratory: Negative for shortness  of breath. Positive for cough. Negative for wheezing.  Gastrointestinal: Negative for nausea,  Negative for vomiting.  no diarrhea.  Musculoskeletal: Positive for body aches Skin: Negative for rash. Neurological: Positive for headaches ____________________________________________   PHYSICAL EXAM:  VITAL SIGNS: ED Triage Vitals  Enc Vitals Group     BP 10/12/19 1615 (!) 175/113     Pulse Rate 10/12/19 1610 (!) 105     Resp 10/12/19 1610 16     Temp 10/12/19 1610 98.7 F (37.1 C)     Temp Source 10/12/19 1610 Oral     SpO2 10/12/19 1610 95 %     Weight 10/12/19 1612 (!) 305 lb (138.3 kg)     Height 10/12/19 1612 6\' 1"  (1.854 m)     Head Circumference --      Peak Flow --      Pain Score 10/12/19 1612 7     Pain Loc --      Pain Edu? --      Excl. in GC? --     Constitutional: Alert and oriented. Acutely ill appearing and in no acute distress. Eyes: Conjunctivae are injected. Ears: TM normal Nose: Maxillary sinus congestion noted; no rhinnorhea. Mouth/Throat: Mucous membranes are moist.  Oropharynx mildly erythematous. Tonsils not visualized. Uvula midline. Neck: No stridor.  Lymphatic: No cervical lymphadenopathy. Cardiovascular: Normal rate, regular rhythm. Good peripheral circulation. Respiratory: Respirations are even and unlabored.  No retractions. Breath sounds clear. Gastrointestinal: Soft and nontender.  Musculoskeletal: FROM x 4 extremities.  Neurologic:  Normal  speech and language. Skin:  Skin is warm, dry and intact. No rash noted. Psychiatric: Mood and affect are normal. Speech and behavior are normal.  ____________________________________________   LABS (all labs ordered are listed, but only abnormal results are displayed)  Labs Reviewed  SARS CORONAVIRUS 2 (TAT 6-24 HRS)   ____________________________________________  EKG  Not indicated. ____________________________________________  RADIOLOGY  Not  indicated. ____________________________________________   PROCEDURES  Procedure(s) performed: None  Critical Care performed: No ____________________________________________   INITIAL IMPRESSION / ASSESSMENT AND PLAN / ED COURSE  39 y.o. male presents to the emergency department for treatment and evaluation of COVID-19 symptoms after exposure. He is noted to be hypertensive. He did not take his medication this morning. He is asymptomatic in regard to hypertension.   Plan will be to send him home on naprosyn and tylenol 3. He will receive a work note as well. He was advised to take his antihypertensive medication as soon as he gets home. He is to return to the ER for symptoms that change or worsen or f or new concerns if unable to schedule an appointment.    Medications - No data to display  ED Discharge Orders         Ordered    acetaminophen-codeine (TYLENOL #3) 300-30 MG tablet  Every 6 hours PRN     10/12/19 1702    naproxen (NAPROSYN) 500 MG tablet  2 times daily with meals     10/12/19 1702           Pertinent labs & imaging results that were available during my care of the patient were reviewed by me and considered in my medical decision making (see chart for details).    If controlled substance prescribed during this visit, 12 month history viewed on the [place] prior to issuing an initial prescription for Schedule II or III opiod. ____________________________________________   FINAL CLINICAL IMPRESSION(S) / ED DIAGNOSES  Final diagnoses:  Close exposure to COVID-19 virus  Acute viral syndrome  Hypertension, unspecified type    Note:  This document was prepared using Dragon voice recognition software and may include unintentional dictation errors.    Victorino Dike, FNP 10/12/19 1713    Duffy Bruce, MD 10/15/19 [phone]

## 2019-10-12 NOTE — ED Triage Notes (Signed)
Cough, fever, body aches that started last night, girlfriend is covid +. NAD.

## 2019-10-13 ENCOUNTER — Telehealth: Payer: Self-pay | Admitting: Emergency Medicine

## 2019-10-13 LAB — SARS CORONAVIRUS 2 (TAT 6-24 HRS): SARS Coronavirus 2: POSITIVE — AB

## 2019-10-13 NOTE — Telephone Encounter (Signed)
Called patient to assure he is aware of positive covid result. Left message.

## 2019-10-14 ENCOUNTER — Telehealth (INDEPENDENT_AMBULATORY_CARE_PROVIDER_SITE_OTHER): Payer: Self-pay | Admitting: Adult Health

## 2019-10-14 ENCOUNTER — Telehealth: Payer: Self-pay | Admitting: Adult Health

## 2019-10-14 NOTE — Telephone Encounter (Signed)
Called pt to discussed SARS-CoV-2 + test, COVID-19 sx's and monoclonal infusion. BMI 40 10/12/2019 + test This is the 2nd attempt MyChart message previously sent.  Alexander Hamburger, NP-C

## 2019-10-14 NOTE — Telephone Encounter (Signed)
Called pt to discussed SARS-CoV-2 + test, COVID-19 sx's and monoclonal infusion. BMI 40 10/12/2019 + test This is the 2nd attempt- left VM MyChart message previously sent.  Alexander Hamburger, NP-C

## 2019-12-14 ENCOUNTER — Encounter: Payer: Self-pay | Admitting: *Deleted

## 2019-12-14 ENCOUNTER — Other Ambulatory Visit: Payer: Self-pay

## 2019-12-14 DIAGNOSIS — L989 Disorder of the skin and subcutaneous tissue, unspecified: Secondary | ICD-10-CM | POA: Insufficient documentation

## 2019-12-14 DIAGNOSIS — Z5321 Procedure and treatment not carried out due to patient leaving prior to being seen by health care provider: Secondary | ICD-10-CM | POA: Insufficient documentation

## 2019-12-14 LAB — CBC
HCT: 45.7 % (ref 39.0–52.0)
Hemoglobin: 14.4 g/dL (ref 13.0–17.0)
MCH: 26.5 pg (ref 26.0–34.0)
MCHC: 31.5 g/dL (ref 30.0–36.0)
MCV: 84.2 fL (ref 80.0–100.0)
Platelets: 336 10*3/uL (ref 150–400)
RBC: 5.43 MIL/uL (ref 4.22–5.81)
RDW: 12.9 % (ref 11.5–15.5)
WBC: 16.2 10*3/uL — ABNORMAL HIGH (ref 4.0–10.5)
nRBC: 0 % (ref 0.0–0.2)

## 2019-12-14 LAB — BASIC METABOLIC PANEL
Anion gap: 8 (ref 5–15)
BUN: 16 mg/dL (ref 6–20)
CO2: 26 mmol/L (ref 22–32)
Calcium: 9.3 mg/dL (ref 8.9–10.3)
Chloride: 104 mmol/L (ref 98–111)
Creatinine, Ser: 0.98 mg/dL (ref 0.61–1.24)
GFR calc Af Amer: >60 mL/min (ref >60)
GFR calc non Af Amer: >60 mL/min (ref >60)
Glucose, Bld: 107 mg/dL — ABNORMAL HIGH (ref 70–99)
Potassium: 3.8 mmol/L (ref 3.5–5.1)
Sodium: 138 mmol/L (ref 135–145)

## 2019-12-14 NOTE — ED Triage Notes (Signed)
Pt has a possible spider bite to left side of neck x 2 days.  Pt stuck a needle in it yesterday.  states pain worse today  No drainage at this time.

## 2019-12-15 ENCOUNTER — Emergency Department
Admission: EM | Admit: 2019-12-15 | Discharge: 2019-12-15 | Disposition: A | Discharge status: Home / Self Care | Payer: Self-pay | Attending: Emergency Medicine | Admitting: Emergency Medicine

## 2019-12-15 NOTE — ED Notes (Signed)
No answer when called several times from lobby 

## 2020-04-07 ENCOUNTER — Other Ambulatory Visit: Payer: Self-pay

## 2020-04-07 ENCOUNTER — Emergency Department
Admission: EM | Admit: 2020-04-07 | Discharge: 2020-04-07 | Disposition: A | Discharge status: Home / Self Care | Payer: HRSA Program | Attending: Emergency Medicine | Admitting: Emergency Medicine

## 2020-04-07 ENCOUNTER — Encounter: Payer: Self-pay | Admitting: Emergency Medicine

## 2020-04-07 DIAGNOSIS — I10 Essential (primary) hypertension: Secondary | ICD-10-CM | POA: Insufficient documentation

## 2020-04-07 DIAGNOSIS — Z20822 Contact with and (suspected) exposure to covid-19: Secondary | ICD-10-CM | POA: Diagnosis present

## 2020-04-07 DIAGNOSIS — F17210 Nicotine dependence, cigarettes, uncomplicated: Secondary | ICD-10-CM | POA: Insufficient documentation

## 2020-04-07 LAB — SARS CORONAVIRUS 2 BY RT PCR (HOSPITAL ORDER, PERFORMED IN ~~LOC~~ HOSPITAL LAB): SARS Coronavirus 2: NEGATIVE

## 2020-04-07 NOTE — ED Notes (Signed)
Pt exposed to covid at work and needs covid test. Denies any symptoms.

## 2020-04-07 NOTE — ED Provider Notes (Signed)
[facility] EMERGENCY DEPARTMENT Provider Note   CSN: [id number] Arrival date & time: 04/07/20  1924     History Chief Complaint  Patient presents with  . Covid Test    Alexander Hebert is a 39 y.o. male.  Presents to the emergency department evaluation of possible Covid exposure.  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  Patient's boss wanted the entire crew Covid tested.  He is here today for Covid test.  He states he is asymptomatic with no headaches fevers chills body aches muscle aches, nausea vomiting or diarrhea.  No cough congestion or runny nose.  No loss of taste or smell.  He is not vaccinated.  HPI     Past Medical History:  Diagnosis Date  . Hypertension     There are no problems to display for this patient.   History reviewed. No pertinent surgical history.     No family history on file.  Social History   Tobacco Use  . Smoking status: Current Every Day Smoker    Packs/day: 1.50    Types: Cigarettes  . Smokeless tobacco: Never Used  Vaping Use  . Vaping Use: Never assessed  Substance Use Topics  . Alcohol use: Not Currently  . Drug use: Yes    Frequency: 7.0 times per week    Types: Marijuana    Home Medications Prior to Admission medications   Medication Sig Start Date End Date Taking? Authorizing Provider  acetaminophen-codeine (TYLENOL #3) 300-30 MG tablet Take 1-2 tablets by mouth every 6 (six) hours as needed for moderate pain. 10/12/19   Kem Boroughs B, FNP  cyclobenzaprine (FLEXERIL) 5 MG tablet Take 1-2 tablets 3 times daily as needed 04/17/19   Enid Derry, PA-C  ibuprofen (ADVIL) 600 MG tablet Take 1 tablet (600 mg total) by mouth every 6 (six) hours as needed. 04/17/19   Enid Derry, PA-C  naproxen (NAPROSYN) 500 MG tablet Take 1 tablet (500 mg total) by mouth 2 (two) times daily with a meal. 10/12/19   Triplett, Cari B, FNP    Allergies    Patient has no known  allergies.  Review of Systems   Review of Systems  Constitutional: Negative.  Negative for activity change, appetite change, chills and fever.  HENT: Negative for congestion, ear pain, mouth sores, rhinorrhea, sinus pressure, sore throat and trouble swallowing.   Eyes: Negative for discharge.  Respiratory: Negative for cough, chest tightness and shortness of breath.   Cardiovascular: Negative for chest pain.  Gastrointestinal: Negative for abdominal pain, diarrhea, nausea and vomiting.  Genitourinary: Negative for difficulty urinating and dysuria.  Musculoskeletal: Negative for myalgias.  Skin: Negative for rash.  Neurological: Negative for dizziness and headaches.  Hematological: Negative for adenopathy.    Physical Exam Updated Vital Signs BP (!) 170/92 (BP Location: Left Arm)   Pulse 91   Temp 98.2 F (36.8 C) (Oral)   Resp 19   Ht 6\' 1"  (1.854 m)   Wt (!) 141.1 kg   SpO2 95%   BMI 41.03 kg/m   Physical Exam  ED Results / Procedures / Treatments   Labs (all labs ordered are listed, but only abnormal results are displayed) Labs Reviewed  SARS CORONAVIRUS 2 BY RT PCR (HOSPITAL ORDER, PERFORMED IN [facility])    EKG None  Radiology No results found.  Procedures Procedures (including critical care time)  Medications Ordered in ED Medications - No data to display  ED Course  I have reviewed the triage vital signs and the nursing notes.  Pertinent labs & imaging results that were available during my care of the patient were reviewed by me and considered in my medical decision making (see chart for details).    MDM Rules/Calculators/A&P                          39 year old male here for Covid testing.  He is asymptomatic.  He understands procedures to follow if his Covid test is negative or positive.  He understands signs symptoms return to the ER for. Final Clinical Impression(s) / ED Diagnoses Final diagnoses:  Close exposure to COVID-19  virus    Rx / DC Orders ED Discharge Orders    None       Ronnette Juniper 04/07/20 2137    Minna Antis, MD 04/08/20 2016

## 2020-04-07 NOTE — ED Triage Notes (Signed)
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  Pt denies any complaints.  Ambulatory to triage, NAD noted at this time.

## 2020-04-07 NOTE — Discharge Instructions (Addendum)
Please quarantine until your Covid test have resulted.  If positive for Covid you will need to stay at home and quarantine for 10 days.  Return to the ER for any cough, chest pain, shortness of breath fevers above 101 or any worsening symptoms or urgent changes in your health

## 2020-04-19 ENCOUNTER — Emergency Department
Admission: EM | Admit: 2020-04-19 | Discharge: 2020-04-19 | Disposition: A | Discharge status: Home / Self Care | Payer: Self-pay | Attending: Emergency Medicine | Admitting: Emergency Medicine

## 2020-04-19 ENCOUNTER — Other Ambulatory Visit: Payer: Self-pay

## 2020-04-19 DIAGNOSIS — I10 Essential (primary) hypertension: Secondary | ICD-10-CM | POA: Insufficient documentation

## 2020-04-19 DIAGNOSIS — F17210 Nicotine dependence, cigarettes, uncomplicated: Secondary | ICD-10-CM | POA: Insufficient documentation

## 2020-04-19 DIAGNOSIS — A64 Unspecified sexually transmitted disease: Secondary | ICD-10-CM | POA: Insufficient documentation

## 2020-04-19 LAB — URINALYSIS, COMPLETE (UACMP) WITH MICROSCOPIC
Bacteria, UA: NONE SEEN
Bilirubin Urine: NEGATIVE
Glucose, UA: NEGATIVE mg/dL
Hgb urine dipstick: NEGATIVE
Ketones, ur: NEGATIVE mg/dL
Nitrite: NEGATIVE
Protein, ur: NEGATIVE mg/dL
Specific Gravity, Urine: 1.028 (ref 1.005–1.030)
pH: 5 (ref 5.0–8.0)

## 2020-04-19 LAB — CHLAMYDIA/NGC RT PCR (ARMC ONLY)
Chlamydia Tr: NOT DETECTED
N gonorrhoeae: DETECTED — AB

## 2020-04-19 MED ORDER — CEFTRIAXONE SODIUM 1 G IJ SOLR
1.0000 g | Freq: Once | INTRAMUSCULAR | Status: AC
  Administered 2020-04-19: 1 g via INTRAMUSCULAR
  Filled 2020-04-19: qty 10

## 2020-04-19 MED ORDER — AZITHROMYCIN 500 MG PO TABS
1000.0000 mg | ORAL_TABLET | Freq: Once | ORAL | Status: AC
  Administered 2020-04-19: 1000 mg via ORAL
  Filled 2020-04-19: qty 2

## 2020-04-19 NOTE — ED Triage Notes (Signed)
Pt comes via POV from home with c/o needing STD check. Pt states he was called and informed the needed to come and get checked out.  Pt states discharge this am. Pt states no pain with urination.

## 2020-04-19 NOTE — ED Provider Notes (Signed)
[facility] Emergency Department Provider Note  ____________________________________________  Time seen: Approximately 5:26 PM  I have reviewed the triage vital signs and the nursing notes.   HISTORY  Chief Complaint STD check    HPI Alexander Hebert is a 39 y.o. male that presents to the emergency department for STD evaluation.  Patient received a call from a partner 2 days ago stating that he needed to be tested for an STD.  He was unable to get evaluated that day due to work.  This morning he noticed some penile discharge.  He has department closed have 4 PM so he came to the emergency department.   Past Medical History:  Diagnosis Date  . Hypertension     There are no problems to display for this patient.   History reviewed. No pertinent surgical history.  Prior to Admission medications   Medication Sig Start Date End Date Taking? Authorizing Provider  acetaminophen-codeine (TYLENOL #3) 300-30 MG tablet Take 1-2 tablets by mouth every 6 (six) hours as needed for moderate pain. 10/12/19   Kem Boroughs B, FNP  cyclobenzaprine (FLEXERIL) 5 MG tablet Take 1-2 tablets 3 times daily as needed 04/17/19   Enid Derry, PA-C  ibuprofen (ADVIL) 600 MG tablet Take 1 tablet (600 mg total) by mouth every 6 (six) hours as needed. 04/17/19   Enid Derry, PA-C  naproxen (NAPROSYN) 500 MG tablet Take 1 tablet (500 mg total) by mouth 2 (two) times daily with a meal. 10/12/19   Triplett, Cari B, FNP    Allergies Patient has no known allergies.  No family history on file.  Social History Social History   Tobacco Use  . Smoking status: Current Every Day Smoker    Packs/day: 1.50    Types: Cigarettes  . Smokeless tobacco: Never Used  Vaping Use  . Vaping Use: Never assessed  Substance Use Topics  . Alcohol use: Not Currently  . Drug use: Yes    Frequency: 7.0 times per week    Types: Marijuana     Review of Systems  Constitutional: No  fever/chills Gastrointestinal: No abdominal pain.  No nausea, no vomiting.  Genitourinary: Negative for dysuria.  Positive for penile discharge. Musculoskeletal: Negative for musculoskeletal pain. Skin: Negative for rash, abrasions, lacerations, ecchymosis. Neurological: Negative for headaches   ____________________________________________   PHYSICAL EXAM:  VITAL SIGNS: ED Triage Vitals [04/19/20 1632]  Enc Vitals Group     BP (!) 174/99     Pulse Rate 85     Resp 18     Temp 97.6 F (36.4 C)     Temp src      SpO2 97 %     Weight (!) 311 lb (141.1 kg)     Height 6\' 1"  (1.854 m)     Head Circumference      Peak Flow      Pain Score 0     Pain Loc      Pain Edu?      Excl. in GC?      Constitutional: Alert and oriented. Well appearing and in no acute distress. Eyes: Conjunctivae are normal. PERRL. EOMI. Head: Atraumatic. ENT:      Ears:      Nose: No congestion/rhinnorhea.      Mouth/Throat: Mucous membranes are moist.  Neck: No stridor.   Cardiovascular: Normal rate, regular rhythm.  Good peripheral circulation. Respiratory: Normal respiratory effort without tachypnea or retractions. Lungs CTAB. Good air entry to the bases with no  decreased or absent breath sounds. Musculoskeletal: Full range of motion to all extremities. No gross deformities appreciated. Neurologic:  Normal speech and language. No gross focal neurologic deficits are appreciated.  Skin:  Skin is warm, dry and intact. No rash noted. Psychiatric: Mood and affect are normal. Speech and behavior are normal. Patient exhibits appropriate insight and judgement.   ____________________________________________   LABS (all labs ordered are listed, but only abnormal results are displayed)  Labs Reviewed  URINALYSIS, COMPLETE (UACMP) WITH MICROSCOPIC - Abnormal; Notable for the following components:      Result Value   Color, Urine YELLOW (*)    APPearance CLEAR (*)    Leukocytes,Ua TRACE (*)    All  other components within normal limits  CHLAMYDIA/NGC RT PCR ([facility] ONLY)   ____________________________________________  EKG   ____________________________________________  RADIOLOGY   No results found.  ____________________________________________    PROCEDURES  Procedure(s) performed:    Procedures    Medications  azithromycin (ZITHROMAX) tablet 1,000 mg (1,000 mg Oral Given 04/19/20 1738)  cefTRIAXone (ROCEPHIN) injection 1 g (1 g Intramuscular Given 04/19/20 1738)     ____________________________________________   INITIAL IMPRESSION / ASSESSMENT AND PLAN / ED COURSE  Pertinent labs & imaging results that were available during my care of the patient were reviewed by me and considered in my medical decision making (see chart for details).  Review of the [place] CSRS was performed in accordance of the [facility] prior to dispensing any controlled drugs.   Patient's diagnosis is consistent with STD.  Vital signs and exam are reassuring.  Patient will be treated empirically for gonorrhea and chlamydia with IM Rocephin and oral azithromycin.  Gonorrhea and Chlamydia tests are pending.  Education was provided.  Patient is to follow up with health department primary care as directed. Patient is given ED precautions to return to the ED for any worsening or new symptoms.   Alexander Hebert was evaluated in Emergency Department on 04/19/2020 for the symptoms described in the history of present illness. He was evaluated in the context of the global COVID-19 pandemic, which necessitated consideration that the patient might be at risk for infection with the SARS-CoV-2 virus that causes COVID-19. Institutional protocols and algorithms that pertain to the evaluation of patients at risk for COVID-19 are in a state of rapid change based on information released by regulatory bodies including the CDC and federal and state organizations. These policies and algorithms were followed during the  patient's care in the ED.  ____________________________________________  FINAL CLINICAL IMPRESSION(S) / ED DIAGNOSES  Final diagnoses:  STD (male)      NEW MEDICATIONS STARTED DURING THIS VISIT:  ED Discharge Orders    None          This chart was dictated using voice recognition software/Dragon. Despite best efforts to proofread, errors can occur which can change the meaning. Any change was purely unintentional.    Enid Derry, PA-C 04/19/20 1833    Jene Every, MD 04/19/20 [phone]

## 2020-04-20 ENCOUNTER — Telehealth: Payer: Self-pay | Admitting: Emergency Medicine

## 2020-04-20 NOTE — Telephone Encounter (Signed)
Called patient to assure he is aware of std results positive for gonorrhea.  He was treated during ED visit.left message.

## 2021-05-19 ENCOUNTER — Encounter: Payer: Self-pay | Admitting: Emergency Medicine

## 2021-05-19 ENCOUNTER — Other Ambulatory Visit: Payer: Self-pay

## 2021-05-19 ENCOUNTER — Emergency Department
Admission: EM | Admit: 2021-05-19 | Discharge: 2021-05-19 | Disposition: A | Discharge status: Home / Self Care | Payer: Self-pay | Attending: Emergency Medicine | Admitting: Emergency Medicine

## 2021-05-19 DIAGNOSIS — Z5321 Procedure and treatment not carried out due to patient leaving prior to being seen by health care provider: Secondary | ICD-10-CM | POA: Insufficient documentation

## 2021-05-19 DIAGNOSIS — Z20822 Contact with and (suspected) exposure to covid-19: Secondary | ICD-10-CM | POA: Insufficient documentation

## 2021-05-19 NOTE — ED Triage Notes (Signed)
Arrives for a COVID test.  States is symptom free and had a possible exposure yesterday.  AAOx3.  Skin warm and dry.  NAD

## 2023-05-07 ENCOUNTER — Other Ambulatory Visit: Payer: Self-pay

## 2023-05-07 ENCOUNTER — Emergency Department
Admission: EM | Admit: 2023-05-07 | Discharge: 2023-05-07 | Disposition: A | Discharge status: Home / Self Care | Payer: Self-pay | Attending: Emergency Medicine | Admitting: Emergency Medicine

## 2023-05-07 DIAGNOSIS — I10 Essential (primary) hypertension: Secondary | ICD-10-CM | POA: Insufficient documentation

## 2023-05-07 DIAGNOSIS — M10032 Idiopathic gout, left wrist: Secondary | ICD-10-CM | POA: Insufficient documentation

## 2023-05-07 LAB — URIC ACID: Uric Acid, Serum: 9.7 mg/dL — ABNORMAL HIGH (ref 3.7–8.6)

## 2023-05-07 LAB — BASIC METABOLIC PANEL
Anion gap: 6 (ref 5–15)
BUN: 19 mg/dL (ref 6–20)
CO2: 26 mmol/L (ref 22–32)
Calcium: 9.2 mg/dL (ref 8.9–10.3)
Chloride: 104 mmol/L (ref 98–111)
Creatinine, Ser: 1.31 mg/dL — ABNORMAL HIGH (ref 0.61–1.24)
GFR, Estimated: >60 mL/min (ref >60)
Glucose, Bld: 101 mg/dL — ABNORMAL HIGH (ref 70–99)
Potassium: 4 mmol/L (ref 3.5–5.1)
Sodium: 136 mmol/L (ref 135–145)

## 2023-05-07 LAB — CBC
HCT: 45.1 % (ref 39.0–52.0)
Hemoglobin: 14 g/dL (ref 13.0–17.0)
MCH: 26 pg (ref 26.0–34.0)
MCHC: 31 g/dL (ref 30.0–36.0)
MCV: 83.8 fL (ref 80.0–100.0)
Platelets: 328 10*3/uL (ref 150–400)
RBC: 5.38 MIL/uL (ref 4.22–5.81)
RDW: 13.2 % (ref 11.5–15.5)
WBC: 14.4 10*3/uL — ABNORMAL HIGH (ref 4.0–10.5)
nRBC: 0 % (ref 0.0–0.2)

## 2023-05-07 MED ORDER — OXYCODONE-ACETAMINOPHEN 5-325 MG PO TABS
1.0000 | ORAL_TABLET | Freq: Once | ORAL | Status: AC
  Administered 2023-05-07: 1 via ORAL
  Filled 2023-05-07: qty 1

## 2023-05-07 MED ORDER — AMLODIPINE BESYLATE 5 MG PO TABS
5.0000 mg | ORAL_TABLET | Freq: Once | ORAL | Status: AC
  Administered 2023-05-07: 5 mg via ORAL
  Filled 2023-05-07: qty 1

## 2023-05-07 MED ORDER — AMLODIPINE BESYLATE 5 MG PO TABS
5.0000 mg | ORAL_TABLET | Freq: Every day | ORAL | 3 refills | Status: DC
  Filled 2023-05-07: qty 30, 30d supply, fill #0

## 2023-05-07 MED ORDER — COLCHICINE 0.6 MG PO TABS
0.6000 mg | ORAL_TABLET | Freq: Every day | ORAL | 0 refills | Status: AC
Start: 2023-05-07 — End: ?
  Filled 2023-05-07: qty 10, 10d supply, fill #0

## 2023-05-07 MED ORDER — COLCHICINE 0.6 MG PO TABS
0.6000 mg | ORAL_TABLET | Freq: Once | ORAL | Status: AC
  Administered 2023-05-07: 0.6 mg via ORAL
  Filled 2023-05-07: qty 1

## 2023-05-07 MED ORDER — HYDROCODONE-ACETAMINOPHEN 5-325 MG PO TABS
1.0000 | ORAL_TABLET | Freq: Three times a day (TID) | ORAL | 0 refills | Status: AC | PRN
  Filled 2023-05-07: qty 9, 3d supply, fill #0

## 2023-05-07 NOTE — ED Notes (Signed)
See triage note  Presents with left wrist pain  States pain started  a few days ago  Denies any injury  Good pulse

## 2023-05-07 NOTE — Discharge Instructions (Addendum)
Your exam is consistent with an acute gout flare to your left wrist.  You also have poorly controlled high blood pressure.  You will be discharged with prescriptions including colchicine to take as needed for ongoing pain.  And Vicodin to take as needed for severe pain.  Take the blood pressure medicine twice daily as discussed.  You should monitor your blood pressure at least a few times a week and record that information for your provider.  Select and follow-up with one of the local community clinics.

## 2023-05-07 NOTE — ED Provider Notes (Signed)
[facility] Emergency Department Provider Note     Event Date/Time   First MD Initiated Contact with Patient 05/07/23 1549     (approximate)   History   Wrist Pain   HPI  Alexander Hebert is a 42 y.o. male with a history of HTN and gout, presents to the ED for left wrist pain.  Patient would endorse onset of symptoms this morning.  Denies any preceding injury or trauma.  He does endorse a history of gout.  Patient denies any chest pain he denies taking any medications for his diagnosis of hypertension.  Physical Exam   Triage Vital Signs: ED Triage Vitals  Encounter Vitals Group     BP 05/07/23 1527 (!) 202/132     Systolic BP Percentile --      Diastolic BP Percentile --      Pulse Rate 05/07/23 1525 90     Resp 05/07/23 1525 20     Temp 05/07/23 1525 98.7 F (37.1 C)     Temp src --      SpO2 05/07/23 1525 94 %     Weight 05/07/23 1526 260 lb (117.9 kg)     Height 05/07/23 1526 6\' 1"  (1.854 m)     Head Circumference --      Peak Flow --      Pain Score 05/07/23 1526 8     Pain Loc --      Pain Education --      Exclude from Growth Chart --     Most recent vital signs: Vitals:   05/07/23 1527 05/07/23 1636  BP: (!) 202/132 (!) 206/146  Pulse:  89  Resp:  20  Temp:    SpO2:  95%    General Awake, no distress. NAD HEENT NCAT. PERRL. EOMI. No rhinorrhea. Mucous membranes are moist.  CV:  Good peripheral perfusion. RRR RESP:  Normal effort. CTA ABD:  No distention.  MSK:  Left hand with significant soft tissue swelling over the dorsal aspect to the mid forearm.  Tenderness over the dorsal radial wrist on palpation.  Normal composite fist however limited secondary to pain.    ED Results / Procedures / Treatments   Labs (all labs ordered are listed, but only abnormal results are displayed) Labs Reviewed  CBC - Abnormal; Notable for the following components:      Result Value   WBC 14.4 (*)    All other components within normal  limits  BASIC METABOLIC PANEL - Abnormal; Notable for the following components:   Glucose, Bld 101 (*)    Creatinine, Ser 1.31 (*)    All other components within normal limits  URIC ACID - Abnormal; Notable for the following components:   Uric Acid, Serum 9.7 (*)    All other components within normal limits    EKG   RADIOLOGY  No results found.   PROCEDURES:  Critical Care performed: No  Procedures   MEDICATIONS ORDERED IN ED: Medications  amLODipine (NORVASC) tablet 5 mg (5 mg Oral Given 05/07/23 1634)  colchicine tablet 0.6 mg (0.6 mg Oral Given 05/07/23 1634)  oxyCODONE-acetaminophen (PERCOCET/ROXICET) 5-325 MG per tablet 1 tablet (1 tablet Oral Given 05/07/23 1634)     IMPRESSION / MDM / ASSESSMENT AND PLAN / ED COURSE  I reviewed the triage vital signs and the nursing notes.  Differential diagnosis includes, but is not limited to, wrist sprain, wrist fracture, gout exacerbation, tendinitis  Patient's presentation is most consistent with acute complicated illness / injury requiring diagnostic workup.   Patient's diagnosis is consistent with acute left wrist gout flare.  Patient also found to have uncontrolled hypertension with a prior diagnosis, with no current medication management.  Labs overall reassuring without evidence of acute or chronic kidney injury and no significant electrolyte abnormalities.  Uric acid was elevated at 9.7.  Patient will be discharged home with prescriptions for colchicine and hydrocodone along with amlodipine. Patient is to follow up with local community clinic for ongoing medical primary care, as needed or otherwise directed. Patient is given ED precautions to return to the ED for any worsening or new symptoms.   FINAL CLINICAL IMPRESSION(S) / ED DIAGNOSES   Final diagnoses:  Acute idiopathic gout of left wrist  Primary hypertension     Rx / DC Orders   ED Discharge Orders          Ordered     amLODipine (NORVASC) 5 MG tablet  Daily        05/07/23 1628    colchicine 0.6 MG tablet  Daily        05/07/23 1628    HYDROcodone-acetaminophen (NORCO) 5-325 MG tablet  3 times daily PRN        05/07/23 1628             Note:  This document was prepared using Dragon voice recognition software and may include unintentional dictation errors.    Lissa Hoard, PA-C 05/07/23 2336    Sharyn Creamer, MD 05/10/23 [phone]

## 2023-05-07 NOTE — ED Triage Notes (Addendum)
Pt to ED for left wrist pain started this am. Hx of gout. Denies known injury  Pt HTN in triage, states not taking meds. Denies cp

## 2023-05-08 ENCOUNTER — Telehealth: Payer: Self-pay

## 2023-05-08 NOTE — Telephone Encounter (Signed)
Received referral from ED. Called pt and left msg with info about [facility].

## 2023-09-29 DIAGNOSIS — R0789 Other chest pain: Secondary | ICD-10-CM | POA: Insufficient documentation

## 2023-09-30 ENCOUNTER — Emergency Department: Payer: Self-pay

## 2023-09-30 ENCOUNTER — Other Ambulatory Visit: Payer: Self-pay

## 2023-09-30 ENCOUNTER — Emergency Department
Admission: EM | Admit: 2023-09-30 | Discharge: 2023-09-30 | Disposition: A | Discharge status: Home / Self Care | Payer: Self-pay | Attending: Emergency Medicine | Admitting: Emergency Medicine

## 2023-09-30 DIAGNOSIS — R0789 Other chest pain: Secondary | ICD-10-CM

## 2023-09-30 LAB — TROPONIN I (HIGH SENSITIVITY)
Troponin I (High Sensitivity): 16 ng/L (ref <18)
Troponin I (High Sensitivity): 18 ng/L — ABNORMAL HIGH (ref <18)

## 2023-09-30 LAB — BASIC METABOLIC PANEL
Anion gap: 8 (ref 5–15)
BUN: 13 mg/dL (ref 6–20)
CO2: 24 mmol/L (ref 22–32)
Calcium: 8.9 mg/dL (ref 8.9–10.3)
Chloride: 105 mmol/L (ref 98–111)
Creatinine, Ser: 1.11 mg/dL (ref 0.61–1.24)
GFR, Estimated: >60 mL/min (ref >60)
Glucose, Bld: 139 mg/dL — ABNORMAL HIGH (ref 70–99)
Potassium: 3.6 mmol/L (ref 3.5–5.1)
Sodium: 137 mmol/L (ref 135–145)

## 2023-09-30 LAB — CBC
HCT: 44.4 % (ref 39.0–52.0)
Hemoglobin: 14.2 g/dL (ref 13.0–17.0)
MCH: 27.1 pg (ref 26.0–34.0)
MCHC: 32 g/dL (ref 30.0–36.0)
MCV: 84.7 fL (ref 80.0–100.0)
Platelets: 314 10*3/uL (ref 150–400)
RBC: 5.24 MIL/uL (ref 4.22–5.81)
RDW: 12.8 % (ref 11.5–15.5)
WBC: 14.2 10*3/uL — ABNORMAL HIGH (ref 4.0–10.5)
nRBC: 0 % (ref 0.0–0.2)

## 2023-09-30 MED ORDER — KETOROLAC TROMETHAMINE 30 MG/ML IJ SOLN
15.0000 mg | Freq: Once | INTRAMUSCULAR | Status: AC
  Administered 2023-09-30: 15 mg via INTRAVENOUS
  Filled 2023-09-30: qty 1

## 2023-09-30 NOTE — Discharge Instructions (Signed)
Please take Tylenol and ibuprofen/Advil for your pain.  It is safe to take them together, or to alternate them every few hours.  Take up to 1000mg of Tylenol at a time, up to 4 times per day.  Do not take more than 4000 mg of Tylenol in 24 hours.  For ibuprofen, take 400-600 mg, 3 - 4 times per day.  

## 2023-09-30 NOTE — ED Triage Notes (Signed)
 Pt to ed from home via POV for CP x 2 days. Pt has more pain when he coughs and takes a deep breath. Pt is caox4, in no acute distress and ambulatory in triage,

## 2023-09-30 NOTE — ED Provider Notes (Signed)
 [facility] Provider Note    Event Date/Time   First MD Initiated Contact with Patient 09/30/23 0133     (approximate)   History   Chest Pain   HPI  Alexander Hebert is a 43 y.o. male who presents to the ED for evaluation of Chest Pain   Patient presents to the ED for evaluation of 2 days of right-sided chest discomfort.  Reports a URI last week, the symptoms are improving, but he had a cough for much of last week.  No chest pain then, but reports chest discomfort these past 2 days with movement, twisting, touching it, deep breaths or leaning forward.   Physical Exam   Triage Vital Signs: ED Triage Vitals  Encounter Vitals Group     BP 09/30/23 0012 (!) 195/122     Systolic BP Percentile --      Diastolic BP Percentile --      Pulse Rate 09/30/23 0012 87     Resp 09/30/23 0012 16     Temp 09/30/23 0012 98.2 F (36.8 C)     Temp Source 09/30/23 0012 Oral     SpO2 09/30/23 0012 98 %     Weight 09/30/23 0005 257 lb 15 oz (117 kg)     Height 09/30/23 0005 6\' 1"  (1.854 m)     Head Circumference --      Peak Flow --      Pain Score 09/30/23 0007 6     Pain Loc --      Pain Education --      Exclude from Growth Chart --     Most recent vital signs: Vitals:   09/30/23 0230 09/30/23 0300  BP: (!) 185/109 (!) 167/102  Pulse: 82 79  Resp:  18  Temp:    SpO2: 94% 99%    General: Awake, no distress.  Obese and well-appearing CV:  Good peripheral perfusion.  RRR Resp:  Normal effort.  Abd:  No distention.  MSK:  No deformity noted.  Reproducible chest discomfort on palpation without overlying skin changes or signs of trauma. Neuro:  No focal deficits appreciated. Other:     ED Results / Procedures / Treatments   Labs (all labs ordered are listed, but only abnormal results are displayed) Labs Reviewed  BASIC METABOLIC PANEL - Abnormal; Notable for the following components:      Result Value   Glucose, Bld 139 (*)    All other  components within normal limits  CBC - Abnormal; Notable for the following components:   WBC 14.2 (*)    All other components within normal limits  TROPONIN I (HIGH SENSITIVITY) - Abnormal; Notable for the following components:   Troponin I (High Sensitivity) 18 (*)    All other components within normal limits  TROPONIN I (HIGH SENSITIVITY)    EKG Sinus rhythm with a rate of 86 bpm.  Right bundle.  Nonspecific ST changes with T wave inversions and mild ST depressions to lateral and inferior leads.  No clear STEMI. Only comparison is from October of last year and essentially identical morphology   RADIOLOGY CXR interpreted by me without evidence of acute cardiopulmonary pathology.  Official radiology report(s): DG Chest 2 View Result Date: 09/30/2023 CLINICAL DATA:  Chest pain EXAM: CHEST - 2 VIEW COMPARISON:  None Available. FINDINGS: The heart size and mediastinal contours are within normal limits. Both lungs are clear. The visualized skeletal structures are unremarkable. IMPRESSION: No active cardiopulmonary disease. Electronically Signed  By: Helyn Numbers M.D.   On: 09/30/2023 00:32    PROCEDURES and INTERVENTIONS:  .1-3 Lead EKG Interpretation  Performed by: Delton Prairie, MD Authorized by: Delton Prairie, MD     Interpretation: normal     ECG rate:  80   ECG rate assessment: normal     Rhythm: sinus rhythm     Ectopy: none     Conduction: normal     Medications  ketorolac (TORADOL) 30 MG/ML injection 15 mg (15 mg Intravenous Given 09/30/23 0222)     IMPRESSION / MDM / ASSESSMENT AND PLAN / ED COURSE  I reviewed the triage vital signs and the nursing notes.  Differential diagnosis includes, but is not limited to, ACS, PTX, PNA, muscle strain/spasm, PE, dissection, anxiety, pleural effusion, pericarditis, myocarditis  {Patient presents with symptoms of an acute illness or injury that is potentially life-threatening.  Patient presents with atypical chest discomfort,  possibly MSK in etiology, with a benign workup and suitable for outpatient management.  Clear CXR without pneumonia or PTX.  Mild white count but I doubt infectious etiology of his symptoms.  2 negative troponins and normal metabolic panel.  Resolution of symptoms with Toradol.  Discussed ED return precautions.  I considered observation admission for this patient  Clinical Course as of 09/30/23 0318  Mon Sep 30, 2023  0308 Reassessed.  Pain resolved with Toradol.  We discussed reassuring second troponin, improving blood pressure.  Discussed plan of care and he is comfortable going home and I think this is reasonable.  We discussed ED return precautions. [DS]    Clinical Course User Index [DS] Delton Prairie, MD     FINAL CLINICAL IMPRESSION(S) / ED DIAGNOSES   Final diagnoses:  Other chest pain     Rx / DC Orders   ED Discharge Orders     None        Note:  This document was prepared using Dragon voice recognition software and may include unintentional dictation errors.    Delton Prairie, MD 09/30/23 [phone]

## 2024-07-20 ENCOUNTER — Encounter (HOSPITAL_COMMUNITY): Payer: Self-pay | Admitting: *Deleted

## 2024-07-20 ENCOUNTER — Emergency Department (HOSPITAL_COMMUNITY)
Admission: EM | Admit: 2024-07-20 | Discharge: 2024-07-20 | Disposition: A | Discharge status: Home / Self Care | Payer: Self-pay | Attending: Emergency Medicine | Admitting: Emergency Medicine

## 2024-07-20 ENCOUNTER — Other Ambulatory Visit: Payer: Self-pay

## 2024-07-20 DIAGNOSIS — R238 Other skin changes: Secondary | ICD-10-CM | POA: Insufficient documentation

## 2024-07-20 DIAGNOSIS — D492 Neoplasm of unspecified behavior of bone, soft tissue, and skin: Secondary | ICD-10-CM

## 2024-07-20 DIAGNOSIS — I10 Essential (primary) hypertension: Secondary | ICD-10-CM | POA: Insufficient documentation

## 2024-07-20 MED ORDER — DOXYCYCLINE HYCLATE 100 MG PO CAPS: 100.0000 mg | ORAL_CAPSULE | Freq: Two times a day (BID) | ORAL | 0 refills | Status: AC

## 2024-07-20 MED ORDER — AMLODIPINE BESYLATE 5 MG PO TABS: 5.0000 mg | ORAL_TABLET | Freq: Every day | ORAL | 3 refills | Status: AC

## 2024-07-20 NOTE — ED Provider Notes (Signed)
 " [place] EMERGENCY DEPARTMENT AT [facility] Provider Note   CSN: [id number] Arrival date & time: 07/20/24  0707     Patient presents with: Abscess   Alexander Hebert is a 43 y.o. male who presents to the ED with a chief complaint of mass under R arm. Patient states that it has been present for 4 years, states that it began draining 2 days ago.  Patient denies infectious symptoms like fever, chills.  Patient states that he has had abscesses in the past that have had to be drained.  Patient currently does not have a primary care provider and has never seen dermatology.  Past medical history significant for hypertension.  Patient states that he has not seen a primary care provider since 2019 or 2020, states that at this time he was previously on blood pressure medication.    Abscess      Prior to Admission medications  Medication Sig Start Date End Date Taking? Authorizing Provider  doxycycline  (VIBRAMYCIN ) 100 MG capsule Take 1 capsule (100 mg total) by mouth 2 (two) times daily for 7 days. 07/20/24 07/27/24 Yes Barrett, Jamie N, PA-C  amLODipine  (NORVASC ) 5 MG tablet Take 1 tablet (5 mg total) by mouth daily. 07/20/24 07/20/25  Barrett, Warren SAILOR, PA-C  colchicine  0.6 MG tablet Take 1 tablet (0.6 mg total) by mouth daily. Patient not taking: Reported on 07/20/2024 05/07/23   Menshew, Candida LULLA Kings, PA-C  cyclobenzaprine  (FLEXERIL ) 5 MG tablet Take 1-2 tablets 3 times daily as needed Patient not taking: Reported on 07/20/2024 04/17/19   Alona Knee, PA-C  ibuprofen  (ADVIL ) 600 MG tablet Take 1 tablet (600 mg total) by mouth every 6 (six) hours as needed. Patient not taking: Reported on 07/20/2024 04/17/19   Alona Knee, PA-C  naproxen  (NAPROSYN ) 500 MG tablet Take 1 tablet (500 mg total) by mouth 2 (two) times daily with a meal. Patient not taking: Reported on 07/20/2024 10/12/19   Herlinda Kirk NOVAK, FNP    Allergies: Patient has no known allergies.    Review of Systems   Skin:        Mass under R axillary region    Updated Vital Signs BP (!) 189/119 (BP Location: Left Arm)   Pulse 70   Temp 97.7 F (36.5 C) (Oral)   Resp 15   Ht 6' 1 (1.854 m)   Wt 104.3 kg   SpO2 100%   BMI 30.34 kg/m   Physical Exam Vitals and nursing note reviewed.  Constitutional:      General: He is awake. He is not in acute distress.    Appearance: Normal appearance. He is obese. He is not ill-appearing, toxic-appearing or diaphoretic.  HENT:     Head: Normocephalic and atraumatic.  Eyes:     General: No scleral icterus. Pulmonary:     Effort: Pulmonary effort is normal. No respiratory distress.  Musculoskeletal:        General: Normal range of motion.     Right lower leg: No edema.     Left lower leg: No edema.     Comments: Grossly normal ROM of all 4 extremities, patient ambulatory without assistance  Skin:    General: Skin is warm.     Capillary Refill: Capillary refill takes less than 2 seconds.     Comments: Large growth consistent with possible skin tag to R axillary region, this mass however is inflamed with serosanguinous drainage, mildly tender to palpation, no surrounding cellulitis or evidence of deep  infection, patient also has previous abscess scars present to abdomen  Neurological:     General: No focal deficit present.     Mental Status: He is alert and oriented to person, place, and time.  Psychiatric:        Mood and Affect: Mood normal.        Behavior: Behavior normal. Behavior is cooperative.      (all labs ordered are listed, but only abnormal results are displayed) Labs Reviewed - No data to display  EKG: None  Radiology: No results found.   Procedures   Medications Ordered in the ED - No data to display                                  Medical Decision Making  Patient presents to the ED for concern of R axillary mass, this involves an extensive number of treatment options, and is a complaint that carries with it a high  risk of complications and morbidity.  The differential diagnosis includes abscess, skin tag, cellulitis, deep skin infection, etc.   Co morbidities that complicate the patient evaluation  HTN   Medicines ordered and prescription drug management:  I ordered medication including doxycyline  for skin infection  Reevaluation of the patient after these medicines showed that the patient improved I have reviewed the patients home medicines and have made adjustments as needed   Test Considered:  Labs/imaging: deferred at this time as patient does not appear with evidence of systemic infection, no fever   Critical Interventions:  none   Problem List / ED Course:  43 year old male, vital signs stable however noticeably hypertensive, presents to the emergency department with a chief complaint of right axillary mass, has been there for approximately 4 years however started draining 2 days ago On physical exam mass consistent with possible skin tag that is inflamed, is actively draining I do not believe lab work or imaging would change course management at this time Will place patient on oral antibiotic and instruct the importance of follow-up with primary care as well as dermatology, no evidence of deep infection or sepsis Will also refill patient's amlodipine  as his blood pressure is significant elevated today, patient tolerated this medication well previously Return precautions given Patient discharged Most likely diagnosis at this time is inflamed skin tag in the setting of hypertension, no evidence of acute life-threatening diagnosis Patient denies headache or other symptom, no sign of hypertensive emergency.   Reevaluation:  After the interventions noted above, I reevaluated the patient and found that they have :improved   Social Determinants of Health:  No PCP   Dispostion:  After consideration of the diagnostic results and the patients response to treatment, I feel that  the patient would benefit from discharge and outpatient therapy as described, follow-up with primary care provider as well as dermatologist.  Establish with a PCP as soon as possible, preferably within the next 1 to 2 weeks.     Final diagnoses:  Abnormal skin growth  Hypertension, unspecified type    ED Discharge Orders          Ordered    amLODipine  (NORVASC ) 5 MG tablet  Daily        07/20/24 0824    doxycycline  (VIBRAMYCIN ) 100 MG capsule  2 times daily        07/20/24 0826    doxycycline  (VIBRAMYCIN ) 100 MG capsule  2 times daily  Pending    amLODipine  (NORVASC ) 5 MG tablet  Daily        Pending               Aarin Sparkman F, PA-C 07/20/24 1856  "

## 2024-07-20 NOTE — ED Triage Notes (Signed)
 C/o abscess onset 4 years , started draining 2 days ago

## 2024-07-20 NOTE — Discharge Instructions (Addendum)
 It was a pleasure taking care of you today.  Based on your history as well as physical exam I feel you are safe for discharge.  I have sent in a antibiotic called doxycycline  to your pharmacy, please take as prescribed and complete the entire course.  I also recommend warm compresses to reduce inflammation.  Please make an appointment with a dermatologist as soon as possible, this is a skin specialist who will instruct you on further care of your skin growth.  I have also sent in a refill on your blood pressure medication.  Your blood pressure was significantly elevated today.  I do recommend that you establish with a primary care provider as soon as possible.  Please follow-up with a primary care provider within the next 1 to 2 weeks and see a dermatologist as soon as possible.  If you experience any of the following symptoms including but not limited to fever, chills, redness surrounding her axillary region, further growth of the skin lesion, worsening drainage, severe pain, or other concerning symptom please return to the emergency department or seek further medical care.

## 2024-07-20 NOTE — ED Provider Triage Note (Signed)
 Emergency Medicine Provider Triage Evaluation Note  Alexander Hebert , a 43 y.o. male  was evaluated in triage.  Pt complains of mass under R arm that has been there for 4 years, area started draining approximately 2 days ago. No infectious symptoms like fever or chills. Patient does appreciate mild increased pain to mass. Patient states his only PMHx is hypertension however he does not have a PCP and has not been on blood pressure medicine since 2019 or 2020.   Review of Systems  Positive: Mass underneath R arm, actively draining Negative: Fever, chills, chest pain, shortness of breath  Physical Exam  BP (!) 189/119 (BP Location: Left Arm)   Pulse 70   Temp 97.7 F (36.5 C) (Oral)   Resp 15   Ht 6' 1 (1.854 m)   Wt 104.3 kg   SpO2 100%   BMI 30.34 kg/m  Gen:   Awake, no distress   Resp:  Normal effort  MSK:   Moves extremities without difficulty  Other:  Draining mass present under R arm, no sign of deep infection, similar to inflamed skin tag  Medical Decision Making  Medically screening exam initiated at 7:50 AM.  Appropriate orders placed.  Alexander Hebert was informed that the remainder of the evaluation will be completed by another provider, this initial triage assessment does not replace that evaluation, and the importance of remaining in the ED until their evaluation is complete.  Patient ultimately discharged with abx from triage, see full encounter note   Janetta Terrall FALCON, DEVONNA 07/20/24 1901
